# Patient Record
Sex: Female | Born: 1955 | Race: White | Hispanic: No | Marital: Married | Smoking: Never smoker
Health system: Southern US, Community
[De-identification: ages and names within clinical notes are randomized; demographics above are authoritative.]

## PROBLEM LIST (undated history)

## (undated) DIAGNOSIS — E119 Type 2 diabetes mellitus without complications: Secondary | ICD-10-CM

## (undated) DIAGNOSIS — E785 Hyperlipidemia, unspecified: Secondary | ICD-10-CM

## (undated) DIAGNOSIS — I10 Essential (primary) hypertension: Secondary | ICD-10-CM

## (undated) DIAGNOSIS — E079 Disorder of thyroid, unspecified: Secondary | ICD-10-CM

---

## 1999-08-07 ENCOUNTER — Ambulatory Visit (HOSPITAL_COMMUNITY): Admission: RE | Admit: 1999-08-07 | Discharge: 1999-08-07 | Payer: Self-pay | Admitting: Family Medicine

## 1999-08-11 ENCOUNTER — Ambulatory Visit (HOSPITAL_COMMUNITY): Admission: RE | Admit: 1999-08-11 | Discharge: 1999-08-11 | Payer: Self-pay | Admitting: Internal Medicine

## 2008-11-10 ENCOUNTER — Encounter: Admission: RE | Admit: 2008-11-10 | Discharge: 2008-11-10 | Payer: Self-pay | Admitting: Obstetrics and Gynecology

## 2015-02-23 DIAGNOSIS — E119 Type 2 diabetes mellitus without complications: Secondary | ICD-10-CM | POA: Insufficient documentation

## 2015-02-23 DIAGNOSIS — E039 Hypothyroidism, unspecified: Secondary | ICD-10-CM | POA: Insufficient documentation

## 2015-02-23 DIAGNOSIS — E785 Hyperlipidemia, unspecified: Secondary | ICD-10-CM | POA: Insufficient documentation

## 2015-02-23 DIAGNOSIS — I10 Essential (primary) hypertension: Secondary | ICD-10-CM | POA: Insufficient documentation

## 2016-03-19 DIAGNOSIS — R519 Headache, unspecified: Secondary | ICD-10-CM | POA: Insufficient documentation

## 2016-03-19 DIAGNOSIS — R51 Headache: Secondary | ICD-10-CM

## 2016-05-23 ENCOUNTER — Encounter (HOSPITAL_COMMUNITY): Payer: Self-pay | Admitting: Emergency Medicine

## 2016-05-23 ENCOUNTER — Ambulatory Visit (HOSPITAL_COMMUNITY)
Admission: EM | Admit: 2016-05-23 | Discharge: 2016-05-23 | Disposition: A | Discharge status: Home / Self Care | Payer: No Typology Code available for payment source | Attending: Emergency Medicine | Admitting: Emergency Medicine

## 2016-05-23 DIAGNOSIS — H1132 Conjunctival hemorrhage, left eye: Secondary | ICD-10-CM

## 2016-05-23 HISTORY — DX: Essential (primary) hypertension: I10

## 2016-05-23 HISTORY — DX: Hyperlipidemia, unspecified: E78.5

## 2016-05-23 HISTORY — DX: Disorder of thyroid, unspecified: E07.9

## 2016-05-23 HISTORY — DX: Type 2 diabetes mellitus without complications: E11.9

## 2016-05-23 NOTE — ED Triage Notes (Addendum)
Pt was at work this morning when she felt something in her eye.  She rubbed the eye and someone later told her that her eye was red.  Pt does not complain of pain, just some mild discomfort with blinking.  The sclera is red.  Pt used medication in and on her eye that she has from her country [place]. Pt has three family members with her to help with translation.  She speaks some English, but they helped with what she did not know.

## 2016-05-23 NOTE — ED Provider Notes (Signed)
HPI  SUBJECTIVE:  Monica Walker is a 60 y.o. female who presents with left conjunctival injection starting earlier today. Patient states that her eye was "tingling" and she scratched it. A coworker noticed that it was red shortly thereafter. She has tried a hydrocortisone, bacitracin ophthalmic ointment on this without improvement. There are no other aggravating or alleviating factors. She denies nausea, vomiting, fevers, eye pain, pain with extraocular movements. No photophobia, headache, URI-like symptoms, sick contacts. She wears glasses for both near and far distance. No known exposure to chemicals. No foreign body sensation. No pain worse in the door, he was around lights. No other known trauma to the eye. He she denies A staxis, hematuria, melena, hematochezia. Past medical history diabetes, hypothyroidism, hypertension, hypercholesterolemia. She is not any anticoagulants or antiplatelets. No history of glaucoma, thrombocytopenia or coagulopathy. PMD: Dr. Forestine NaKim Briscoff.   All history obtained through family acting as interpreter.  Past Medical History:  Diagnosis Date  . Diabetes mellitus without complication (HCC)   . HLD (hyperlipidemia)   . Hypertension   . Thyroid disease     History reviewed. No pertinent surgical history.  History reviewed. No pertinent family history.  Social History  Substance Use Topics  . Smoking status: Never Smoker  . Smokeless tobacco: Never Used  . Alcohol use No    No current facility-administered medications for this encounter.   Current Outpatient Prescriptions:  .  levothyroxine (SYNTHROID, LEVOTHROID) 100 MCG tablet, Take 100 mcg by mouth daily before breakfast., Disp: , Rfl:  .  metFORMIN (GLUCOPHAGE) 1000 MG tablet, Take 500 mg by mouth 2 (two) times daily with a meal., Disp: , Rfl:  .  simvastatin (ZOCOR) 10 MG tablet, Take 10 mg by mouth daily., Disp: , Rfl:  .  valsartan (DIOVAN) 80 MG tablet, Take 80 mg by mouth daily., Disp: , Rfl:    No Known Allergies   ROS  As noted in HPI.   Physical Exam  BP 158/82 (BP Location: Left Arm)   Pulse 65   Temp 97.6 F (36.4 C) (Oral)   SpO2 99%   Constitutional: Well developed, well nourished, no acute distress Eyes:  EOMI, Positive large subconjunctival hemorrhage left eye. No chemosis. PERRLA, no pain with EOMs. No direct or consensual photophobia. No evidence of entrapment. No foreign body seen on lid eversion. No foreign body or corneal abrasion seen on fluorescein exam. Seidel negative. No periorbital erythema, edema. Corrected Visual acuity left eye 20/20.  HENT: Normocephalic, atraumatic,mucus membranes moist Respiratory: Normal inspiratory effort Cardiovascular: Normal rate GI: nondistended skin: No rash, skin intact Musculoskeletal: no deformities Neurologic: Alert & oriented x 3, no focal neuro deficits Psychiatric: Speech and behavior appropriate   ED Course   Medications - No data to display  No orders of the defined types were placed in this encounter.   No results found for this or any previous visit (from the past 24 hour(s)). No results found.  ED Clinical Impression  Subconjunctival hemorrhage of left eye   ED Assessment/Plan  Presentation suggestive of a a large subconjunctival hemorrhage. No evidence of glaucoma, infection, abrasion, or foreign body, or globe penetration. Cool compresses, We'll have her follow-up with Dr. Randon GoldsmithLyles, ophthalmology on call if no better in 2 or 3 days.  Discussed MDM, plan and followup with patient and family.   Family agrees with plan.   *This clinic note was created using Dragon dictation software. Therefore, there may be occasional mistakes despite careful proofreading.  ?   Domenick GongAshley Del Wiseman,  MD 05/24/16 1610

## 2016-05-23 NOTE — Discharge Instructions (Signed)
Cool compresses for now, then switch to warm compresses in several days. Go to the ER for blurry vision, double vision, headache, if you see halos around lights, eye pain, fevers above 100.4, or other concerns.

## 2016-06-27 DIAGNOSIS — K635 Polyp of colon: Secondary | ICD-10-CM | POA: Insufficient documentation

## 2016-06-29 ENCOUNTER — Other Ambulatory Visit: Payer: Self-pay | Admitting: Family Medicine

## 2016-06-30 LAB — CMP12+LP+TP+TSH+6AC+CBC/D/PLT
A/G RATIO: 1.9 (ref 1.2–2.2)
ALK PHOS: 63 IU/L (ref 39–117)
ALT: 16 IU/L (ref 0–32)
AST: 16 IU/L (ref 0–40)
Albumin: 4.4 g/dL (ref 3.5–5.5)
BASOS: 1 %
BUN/Creatinine Ratio: 25 — ABNORMAL HIGH (ref 9–23)
BUN: 19 mg/dL (ref 6–24)
Basophils Absolute: 0 10*3/uL (ref 0.0–0.2)
Bilirubin Total: 0.6 mg/dL (ref 0.0–1.2)
Calcium: 9.5 mg/dL (ref 8.7–10.2)
Chloride: 100 mmol/L (ref 96–106)
Chol/HDL Ratio: 2.4 ratio units (ref 0.0–4.4)
Cholesterol, Total: 177 mg/dL (ref 100–199)
Creatinine, Ser: 0.76 mg/dL (ref 0.57–1.00)
EOS (ABSOLUTE): 0.3 10*3/uL (ref 0.0–0.4)
Eos: 5 %
Estimated CHD Risk: <0.5 times avg. (ref 0.0–1.0)
Free Thyroxine Index: 2.7 (ref 1.2–4.9)
GFR calc non Af Amer: 87 mL/min/{1.73_m2} (ref >59)
GFR, EST AFRICAN AMERICAN: 100 mL/min/{1.73_m2} (ref >59)
GGT: 16 IU/L (ref 0–60)
GLOBULIN, TOTAL: 2.3 g/dL (ref 1.5–4.5)
Glucose: 115 mg/dL — ABNORMAL HIGH (ref 65–99)
HDL: 73 mg/dL (ref >39)
HEMATOCRIT: 38.5 % (ref 34.0–46.6)
Hemoglobin: 13 g/dL (ref 11.1–15.9)
IMMATURE GRANS (ABS): 0 10*3/uL (ref 0.0–0.1)
IMMATURE GRANULOCYTES: 0 %
Iron: 109 ug/dL (ref 27–159)
LDH: 235 IU/L — AB (ref 119–226)
LDL CALC: 82 mg/dL (ref 0–99)
LYMPHS: 31 %
Lymphocytes Absolute: 2 10*3/uL (ref 0.7–3.1)
MCH: 28.5 pg (ref 26.6–33.0)
MCHC: 33.8 g/dL (ref 31.5–35.7)
MCV: 84 fL (ref 79–97)
MONOCYTES: 6 %
Monocytes Absolute: 0.4 10*3/uL (ref 0.1–0.9)
NEUTROS ABS: 3.8 10*3/uL (ref 1.4–7.0)
Neutrophils: 57 %
Phosphorus: 3.1 mg/dL (ref 2.5–4.5)
Platelets: 143 10*3/uL — ABNORMAL LOW (ref 150–379)
Potassium: 4 mmol/L (ref 3.5–5.2)
RBC: 4.56 x10E6/uL (ref 3.77–5.28)
RDW: 13.9 % (ref 12.3–15.4)
Sodium: 141 mmol/L (ref 134–144)
T3 Uptake Ratio: 29 % (ref 24–39)
T4, Total: 9.4 ug/dL (ref 4.5–12.0)
TRIGLYCERIDES: 110 mg/dL (ref 0–149)
TSH: 2.12 u[IU]/mL (ref 0.450–4.500)
Total Protein: 6.7 g/dL (ref 6.0–8.5)
Uric Acid: 4.4 mg/dL (ref 2.5–7.1)
VLDL Cholesterol Cal: 22 mg/dL (ref 5–40)
WBC: 6.5 10*3/uL (ref 3.4–10.8)

## 2016-06-30 LAB — HGB A1C W/O EAG: Hgb A1c MFr Bld: 6.4 % — ABNORMAL HIGH (ref 4.8–5.6)

## 2017-01-22 ENCOUNTER — Ambulatory Visit: Payer: Self-pay

## 2017-01-22 ENCOUNTER — Other Ambulatory Visit: Payer: Self-pay | Admitting: Occupational Medicine

## 2017-01-22 ENCOUNTER — Ambulatory Visit: Payer: Self-pay | Admitting: Registered Nurse

## 2017-01-22 VITALS — BP 135/87 | HR 74 | Temp 98.4°F

## 2017-01-22 DIAGNOSIS — M79645 Pain in left finger(s): Secondary | ICD-10-CM

## 2017-01-22 DIAGNOSIS — S80211A Abrasion, right knee, initial encounter: Secondary | ICD-10-CM

## 2017-01-22 DIAGNOSIS — S60012A Contusion of left thumb without damage to nail, initial encounter: Secondary | ICD-10-CM

## 2017-01-22 DIAGNOSIS — M79642 Pain in left hand: Secondary | ICD-10-CM

## 2017-01-22 MED ORDER — ACETAMINOPHEN 325 MG PO TABS: 650.0000 mg | ORAL_TABLET | Freq: Four times a day (QID) | ORAL | Status: AC | PRN

## 2017-01-22 NOTE — Patient Instructions (Addendum)
Patient to go to [facility] for xrays/further evaluation today Wash abrasion right knee daily with soap and water apply topical antibiotic and cover with bandage until healed Follow up for re-evaluation if worsening redness/ purulent discharge, increased pain/swelling Apply ice pack to left hand and right knee four times a day x 15 minutes as needed pain/swelling Tylenol 650-975mg  by mouth every 6 hours as needed for pain Has naproxen  at home may take twice a day  Avoid motrin/advil/aleve/ibuprofen when taking naproxen same day Hydrate avoid dehdyration Given ace wrap from clinic stock may wrap right knee as needed for comfort Elevate left hand and right knee when sitting Fitted and distributed left wrist/thumb spica splint from clinic stock wear until told to stop use  Notify supervisor and HR of injury today when walking into building  Abrasion An abrasion is a cut or scrape on the outer surface of your skin. An abrasion does not extend through all of the layers of your skin. It is important to care for your abrasion properly to prevent infection. What are the causes? Most abrasions are caused by falling on or gliding across the ground or another surface. When your skin rubs on something, the outer and inner layer of skin rubs off. What are the signs or symptoms? A cut or scrape is the main symptom of this condition. The scrape may be bleeding, or it may appear red or pink. If there was an associated fall, there may be an underlying bruise. How is this diagnosed? An abrasion is diagnosed with a physical exam. How is this treated? Treatment for this condition depends on how large and deep the abrasion is. Usually, your abrasion will be cleaned with water and mild soap. This removes any dirt or debris that may be stuck. An antibiotic ointment may be applied to the abrasion to help prevent infection. A bandage (dressing) may be placed on the abrasion to keep it  clean. You may also need a tetanus shot. Follow these instructions at home: Medicines   Take or apply medicines only as directed by your health care provider.  If you were prescribed an antibiotic ointment, finish all of it even if you start to feel better. Wound care   Clean the wound with mild soap and water 2-3 times per day or as directed by your health care provider. Pat your wound dry with a clean towel. Do not rub it.  There are many different ways to close and cover a wound. Follow instructions from your health care provider about:  Wound care.  Dressing changes and removal.  Check your wound every day for signs of infection. Watch for:  Redness, swelling, or pain.  Fluid, blood, or pus. General instructions    Keep the dressing dry as directed by your health care provider. Do not take baths, swim, use a hot tub, or do anything that would put your wound underwater until your health care provider approves.  If there is swelling, raise (elevate) the injured area above the level of your heart while you are sitting or lying down.  Keep all follow-up visits as directed by your health care provider. This is important. Contact a health care provider if:  You received a tetanus shot and you have swelling, severe pain, redness, or bleeding at the injection site.  Your pain is not controlled with medicine.  You have increased redness, swelling, or pain at the site of your wound. Get help right away if:  You have  a red streak going away from your wound.  You have a fever.  You have fluid, blood, or pus coming from your wound.  You notice a bad smell coming from your wound or your dressing. This information is not intended to replace advice given to you by your health care provider. Make sure you discuss any questions you have with your health care provider. Document Released: 06/20/2005 Document Revised: 05/11/2016 Document Reviewed: 09/08/2014 Elsevier Interactive  Patient Education  2017 [employer]  Contusion A contusion is a deep bruise. Contusions are the result of a blunt injury to tissues and muscle fibers under the skin. The injury causes bleeding under the skin. The skin overlying the contusion may turn blue, purple, or yellow. Minor injuries will give you a painless contusion, but more severe contusions may stay painful and swollen for a few weeks. What are the causes? This condition is usually caused by a blow, trauma, or direct force to an area of the body. What are the signs or symptoms? Symptoms of this condition include:  Swelling of the injured area.  Pain and tenderness in the injured area.  Discoloration. The area may have redness and then turn blue, purple, or yellow. How is this diagnosed? This condition is diagnosed based on a physical exam and medical history. An X-ray, CT scan, or MRI may be needed to determine if there are any associated injuries, such as broken bones (fractures). How is this treated? Specific treatment for this condition depends on what area of the body was injured. In general, the best treatment for a contusion is resting, icing, applying pressure to (compression), and elevating the injured area. This is often called the RICE strategy. Over-the-counter anti-inflammatory medicines may also be recommended for pain control. Follow these instructions at home:  Rest the injured area.  If directed, apply ice to the injured area:  Put ice in a plastic bag.  Place a towel between your skin and the bag.  Leave the ice on for 20 minutes, 2-3 times per day.  If directed, apply light compression to the injured area using an elastic bandage. Make sure the bandage is not wrapped too tightly. Remove and reapply the bandage as directed by your health care provider.  If possible, raise (elevate) the injured area above the level of your heart while you are sitting or lying down.  Take over-the-counter and  prescription medicines only as told by your health care provider. Contact a health care provider if:  Your symptoms do not improve after several days of treatment.  Your symptoms get worse.  You have difficulty moving the injured area. Get help right away if:  You have severe pain.  You have numbness in a hand or foot.  Your hand or foot turns pale or cold. This information is not intended to replace advice given to you by your health care provider. Make sure you discuss any questions you have with your health care provider. Document Released: 06/20/2005 Document Revised: 01/19/2016 Document Reviewed: 01/26/2015 Elsevier Interactive Patient Education  2017 [employer] Thumb Sprain A thumb sprain is an injury to one of the strong bands of tissue (ligaments) that connect the bones in your thumb. The ligament can be stretched too much or it can tear. A tear can be either partial or complete. The severity of the sprain depends on how much of the ligament was damaged or torn. What are the causes? A thumb sprain is often caused by a fall or an accident. If  you extend your hands to catch an object or to protect yourself, the force of the impact can cause your ligament to stretch too much. This excess tension can also cause your ligament to tear. What increases the risk? This injury is more likely to occur in people who play:  Sports that involve a greater risk of falling, such as skiing.  Sports that involve catching an object, such as basketball. What are the signs or symptoms? Symptoms of this condition include:  Loss of motion in your thumb.  Bruising.  Tenderness.  Swelling. How is this diagnosed? This condition is diagnosed with a medical history and physical exam. You may also have an X-ray of your thumb. How is this treated? Treatment varies depending on the severity of your sprain. If your ligament is overstretched or partially torn, treatment usually involves keeping  your thumb in a fixed position (immobilization) for a period of time. To help you do this, your health care provider will apply a bandage, cast, or splint to keep your thumb from moving until it heals. If your ligament is fully torn, you may need surgery to reconnect the ligament to the bone. After surgery, a cast or splint will be applied and will need to stay on your thumb while it heals. Your health care provider may also suggest exercises or physical therapy to strengthen your thumb. Follow these instructions at home: If you have a cast:   Do not stick anything inside the cast to scratch your skin. Doing that increases your risk of infection.  Check the skin around the cast every day. Report any concerns to your health care provider. You may put lotion on dry skin around the edges of the cast. Do not apply lotion to the skin underneath the cast.  Keep the cast clean and dry. If you have a splint:   Wear it as directed by your health care provider. Remove it only as directed by your health care provider.  Loosen the splint if your fingers become numb and tingle, or if they turn cold and blue.  Keep the splint clean and dry. Bathing   Cover the bandage, cast, or splint with a watertight plastic bag to protect it from water while you take a bath or a shower. Do not let the bandage, cast, or splint get wet. Managing pain, stiffness, and swelling   If directed, apply ice to the injured area (unless you have a cast):  Put ice in a plastic bag.  Place a towel between your skin and the bag.  Leave the ice on for 20 minutes, 2-3 times per day.  Move your fingers often to avoid stiffness and to lessen swelling.  Raise (elevate) the injured area above the level of your heart while you are sitting or lying down. Driving   Do not drive or operate heavy machinery while taking pain medicine.  Do not drive while wearing a cast or splint on a hand that you use for driving. General  instructions   Do not put pressure on any part of your cast or splint until it is fully hardened. This may take several hours.  Take medicines only as directed by your health care provider. These include over-the-counter medicines and prescription medicines.  Keep all follow-up visits as directed by your health care provider. This is important.  Do any exercise or physical therapy as directed by your health care provider.  Do not wear rings on your injured thumb. Contact a health care provider  if:  Your pain is not controlled with medicine.  Your bruising or swelling gets worse.  Your cast or splint is damaged. Get help right away if:  Your thumb is numb or blue.  Your thumb feels colder than normal. This information is not intended to replace advice given to you by your health care provider. Make sure you discuss any questions you have with your health care provider. Document Released: 10/18/2004 Document Revised: 05/13/2016 Document Reviewed: 06/22/2014 Elsevier Interactive Patient Education  2017 [employer]  Cryotherapy WHAT IS CRYOTHERAPY? Cryotherapy, or cold therapy, is a treatment that uses cold temperatures to treat an injury or medical condition. It includes using cold packs or ice packs to reduce pain and swelling. WHO SHOULD NOT USE CRYOTHERAPY? Cryotherapy is not safe for people who cannot tell you if they are in pain, such as small children and people who have dementia. Cryotherapy is also not safe for people with certain conditions, such as:  Raynaud phenomenon.  Cold hypersensitivity.  Numbness or loss of feeling in the area being iced. Cryotherapy may or may not be safe for people with certain other conditions. Do not use cryotherapy without your health care provider's approval if you have:  A heart condition.  High blood pressure.  Open or healing wounds.  An infection.  Rheumatoid arthritis.  Poor circulation.  Diabetes.  Certain skin  conditions. HOW DO I USE CRYOTHERAPY? To use cryotherapy at home to reduce pain and swelling:  Place a towel between the cold source and your skin.  Apply the cold source for no more than 20 minutes at a time.  Check your skin after 5 minutes to make sure there are no signs of a poor response to cold or skin damage. Check for:  White spots on your skin. Your skin may look blotchy or mottled.  Skin that looks blue or pale.  Skin that feels waxy or hard.  Repeat these steps as many times each day as told by your health care provider. HOW CAN I MAKE A COLD PACK? When using a cold pack at home to reduce pain and swelling, you can use:  A silica gel cold pack that has been left in the freezer. You can buy this online or in stores.  A plastic bag of frozen vegetables.  A sealable plastic bag that has been filled with crushed ice. Always wrap the pack in a dry or damp towel to avoid direct contact with your skin. WHEN SHOULD I CALL MY HEALTH CARE PROVIDER? Call your health care provider if:  You develop white spots on your skin. This may give your skin a blotchy or mottled look.  Your skin turns blue or pale.  Your skin becomes waxy or hard.  Your swelling gets worse. This information is not intended to replace advice given to you by your health care provider. Make sure you discuss any questions you have with your health care provider. Document Released: 05/07/2011 Document Revised: 02/16/2016 Document Reviewed: 05/25/2015 Elsevier Interactive Patient Education  2017 [employer] Thumb Sprain A thumb sprain is an injury to one of the strong bands of tissue (ligaments) that connect the bones in your thumb. The ligament can be stretched too much or it can tear. A tear can be either partial or complete. The severity of the sprain depends on how much of the ligament was damaged or torn. What are the causes? A thumb sprain is often caused by a fall or an accident. If you extend your  hands to catch an object or to protect yourself, the force of the impact can cause your ligament to stretch too much. This excess tension can also cause your ligament to tear. What increases the risk? This injury is more likely to occur in people who play:  Sports that involve a greater risk of falling, such as skiing.  Sports that involve catching an object, such as basketball. What are the signs or symptoms? Symptoms of this condition include:  Loss of motion in your thumb.  Bruising.  Tenderness.  Swelling. How is this diagnosed? This condition is diagnosed with a medical history and physical exam. You may also have an X-ray of your thumb. How is this treated? Treatment varies depending on the severity of your sprain. If your ligament is overstretched or partially torn, treatment usually involves keeping your thumb in a fixed position (immobilization) for a period of time. To help you do this, your health care provider will apply a bandage, cast, or splint to keep your thumb from moving until it heals. If your ligament is fully torn, you may need surgery to reconnect the ligament to the bone. After surgery, a cast or splint will be applied and will need to stay on your thumb while it heals. Your health care provider may also suggest exercises or physical therapy to strengthen your thumb. Follow these instructions at home: If you have a cast:   Do not stick anything inside the cast to scratch your skin. Doing that increases your risk of infection.  Check the skin around the cast every day. Report any concerns to your health care provider. You may put lotion on dry skin around the edges of the cast. Do not apply lotion to the skin underneath the cast.  Keep the cast clean and dry. If you have a splint:   Wear it as directed by your health care provider. Remove it only as directed by your health care provider.  Loosen the splint if your fingers become numb and tingle, or if they  turn cold and blue.  Keep the splint clean and dry. Bathing   Cover the bandage, cast, or splint with a watertight plastic bag to protect it from water while you take a bath or a shower. Do not let the bandage, cast, or splint get wet. Managing pain, stiffness, and swelling   If directed, apply ice to the injured area (unless you have a cast):  Put ice in a plastic bag.  Place a towel between your skin and the bag.  Leave the ice on for 20 minutes, 2-3 times per day.  Move your fingers often to avoid stiffness and to lessen swelling.  Raise (elevate) the injured area above the level of your heart while you are sitting or lying down. Driving   Do not drive or operate heavy machinery while taking pain medicine.  Do not drive while wearing a cast or splint on a hand that you use for driving. General instructions   Do not put pressure on any part of your cast or splint until it is fully hardened. This may take several hours.  Take medicines only as directed by your health care provider. These include over-the-counter medicines and prescription medicines.  Keep all follow-up visits as directed by your health care provider. This is important.  Do any exercise or physical therapy as directed by your health care provider.  Do not wear rings on your injured thumb. Contact a health care provider if:  Your pain  is not controlled with medicine.  Your bruising or swelling gets worse.  Your cast or splint is damaged. Get help right away if:  Your thumb is numb or blue.  Your thumb feels colder than normal. This information is not intended to replace advice given to you by your health care provider. Make sure you discuss any questions you have with your health care provider. Document Released: 10/18/2004 Document Revised: 05/13/2016 Document Reviewed: 06/22/2014 Elsevier Interactive Patient Education  2017 [employer].

## 2017-01-22 NOTE — Progress Notes (Signed)
Subjective:    Patient ID: Monica Walker, female    DOB: [birth year], 61 y.o.   MRN: [id number]  60y/o caucasian female accompanied by coworker as [place] second language primary language serbian croatian.  Pt fell walking in to work this morning from parking lot. Stepped up on curb but foot turned off it and she fell forward into the bushes lining the walkway. Has abrasion to R knee and left thumb struck pavement unsure if it was hyperextended.  Right hand dominant. Worst pain is in L thumb where she braced herself while falling.  Mild bruising and swelling to proximal joint of thumb. TTP. Limited ROM 2/2 pain.   Patient requesting evaluation so she can return to work. Denied hitting head/loss of consciousness.  Requesting pain medication as her prn naproxen at home.  Has ice pack at home.      Review of Systems  Constitutional: Negative for activity change, appetite change, chills, diaphoresis, fatigue and fever.  HENT: Negative for congestion, ear pain and sore throat.   Eyes: Negative for pain and discharge.  Respiratory: Negative for cough and wheezing.   Cardiovascular: Negative for chest pain and leg swelling.  Gastrointestinal: Negative for blood in stool, constipation, diarrhea, nausea and vomiting.  Endocrine: Negative for cold intolerance and heat intolerance.  Genitourinary: Negative for difficulty urinating, dysuria and hematuria.  Musculoskeletal: Positive for arthralgias, joint swelling and myalgias. Negative for back pain, gait problem, neck pain and neck stiffness.  Skin: Positive for color change and wound. Negative for pallor and rash.  Allergic/Immunologic: Negative for environmental allergies and food allergies.  Neurological: Negative for dizziness, tremors, seizures, syncope, facial asymmetry, speech difficulty, weakness, light-headedness, numbness and headaches.  Hematological: Negative for adenopathy. Does not bruise/bleed easily.  Psychiatric/Behavioral: Negative  for agitation and confusion. The patient is not nervous/anxious.        Objective:   Physical Exam  Constitutional: She is oriented to person, place, and time. Vital signs are normal. She appears well-developed and well-nourished. She is active and cooperative.  Non-toxic appearance. She does not have a sickly appearance. She does not appear ill. No distress.  HENT:  Head: Normocephalic and atraumatic.  Right Ear: Hearing and external ear normal.  Left Ear: Hearing and external ear normal.  Nose: Nose normal. No mucosal edema, rhinorrhea, nose lacerations, sinus tenderness, nasal deformity, septal deviation or nasal septal hematoma. No epistaxis.  No foreign bodies.  Mouth/Throat: Uvula is midline, oropharynx is clear and moist and mucous membranes are normal. Mucous membranes are not pale, not dry and not cyanotic. She does not have dentures. No oral lesions. No trismus in the jaw. Normal dentition. No dental abscesses, uvula swelling, lacerations or dental caries. No oropharyngeal exudate, posterior oropharyngeal edema, posterior oropharyngeal erythema or tonsillar abscesses.  Eyes: Conjunctivae, EOM and lids are normal. Pupils are equal, round, and reactive to light. Right eye exhibits no chemosis, no discharge, no exudate and no hordeolum. No foreign body present in the right eye. Left eye exhibits no chemosis, no discharge, no exudate and no hordeolum. No foreign body present in the left eye. Right conjunctiva is not injected. Right conjunctiva has no hemorrhage. Left conjunctiva is not injected. Left conjunctiva has no hemorrhage. No scleral icterus. Right eye exhibits normal extraocular motion and no nystagmus. Left eye exhibits normal extraocular motion and no nystagmus. Right pupil is round and reactive. Left pupil is round and reactive. Pupils are equal.  Neck: Trachea normal, normal range of motion and phonation normal. Neck supple.  No tracheal tenderness, no spinous process tenderness and  no muscular tenderness present. No neck rigidity. No tracheal deviation, no edema, no erythema and normal range of motion present. No thyroid mass and no thyromegaly present.  Cardiovascular: Normal rate, regular rhythm, S1 normal, S2 normal, normal heart sounds and intact distal pulses.  PMI is not displaced.  Exam reveals no gallop and no friction rub.   No murmur heard. Pulses:      Radial pulses are 2+ on the right side, and 2+ on the left side.       Dorsalis pedis pulses are 2+ on the right side, and 2+ on the left side.  Pulmonary/Chest: Effort normal and breath sounds normal. No accessory muscle usage or stridor. No respiratory distress. She has no decreased breath sounds. She has no wheezes. She has no rhonchi. She has no rales. She exhibits no tenderness.  Abdominal: Soft. She exhibits no distension.  Musculoskeletal: Normal range of motion. She exhibits edema, tenderness and deformity.       Right shoulder: Normal.       Left shoulder: Normal.       Right elbow: Normal.      Left elbow: Normal.       Right wrist: Normal.       Left wrist: Normal.       Right hip: Normal.       Left hip: Normal.       Right knee: She exhibits erythema. She exhibits normal range of motion, no swelling, no effusion, no ecchymosis, no deformity, no laceration, normal alignment, no LCL laxity, normal patellar mobility, no bony tenderness, normal meniscus and no MCL laxity. Tenderness found. No medial joint line, no lateral joint line, no MCL, no LCL and no patellar tendon tenderness noted.       Left knee: Normal.       Right ankle: Normal.       Left ankle: Normal.       Cervical back: Normal.       Right upper arm: Normal.       Left upper arm: Normal.       Right forearm: Normal.       Left forearm: Normal.       Right hand: Normal.       Left hand: She exhibits tenderness, bony tenderness and swelling. She exhibits normal range of motion, normal two-point discrimination, normal capillary refill,  no deformity and no laceration. Normal sensation noted. Normal strength noted. She exhibits no finger abduction, no thumb/finger opposition and no wrist extension trouble.       Hands:      Right upper leg: Normal.       Left upper leg: Normal.       Right lower leg: Normal.       Left lower leg: Normal.       Right foot: Normal.       Left foot: Normal.  Left snuff box tenderness and ecchymosis over dorsum PIP joint and adjacent soft tissue 1cm diameter edema 1+/4 nonpitting; full AROM aok sign all fingers and full wrist AROM/elbow/shouder; on/off exam table without difficulty ambulating in hallway without limp/in/out chair without difficulty; abrasion x 2 anterior right knee over patella superior smaller 1cm diameter and larger distal 2cm diameter no active bleeding cleansed with first aid spray then applied triple antibiotic ointment and covered with bandaid and secured with cobain dressing clean dry and intact on discharge; negative crepitus/lachman's/anterior/posterior drawer sign/valgus/varus stress tests  Lymphadenopathy:       Head (right side): No submental, no submandibular, no tonsillar, no preauricular, no posterior auricular and no occipital adenopathy present.       Head (left side): No submental, no submandibular, no tonsillar, no preauricular, no posterior auricular and no occipital adenopathy present.    She has no cervical adenopathy.       Right cervical: No superficial cervical, no deep cervical and no posterior cervical adenopathy present.      Left cervical: No superficial cervical, no deep cervical and no posterior cervical adenopathy present.  Neurological: She is alert and oriented to person, place, and time. She has normal strength. She is not disoriented. She displays no atrophy and no tremor. No cranial nerve deficit or sensory deficit. She exhibits normal muscle tone. She displays no seizure activity. Coordination and gait normal. GCS eye subscore is 4. GCS verbal subscore  is 5. GCS motor subscore is 6.  Strength 5/5 arms/legs/hands bilaterally  Skin: Skin is warm and dry. Abrasion, bruising, ecchymosis and rash noted. No burn, no laceration, no lesion, no petechiae and no purpura noted. Rash is macular. Rash is not papular, not maculopapular, not nodular, not pustular, not vesicular and not urticarial. She is not diaphoretic. There is erythema. No cyanosis. No pallor. Nails show no clubbing.     Psychiatric: She has a normal mood and affect. Her speech is normal and behavior is normal. Judgment and thought content normal. Cognition and memory are normal.  Nursing note and vitals reviewed.         Assessment & Plan:  A-right knee abrasion; left hand pain; thumb pain/contusion left  P-Fitted and distributed left wrist/thumb spica splint from clinic stock wear until cleared from fracture.  Recommend xrays today as snuff box pain and ecchymosis/edema left thumb dorsum.  Disccussed could be contusion/fracture thumb/hand, ligament/tendon tear.  Speak with supervisor and HR rep regarding accident in parking lot today to determine if worker's compensation eligible.  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  Motrin  po administered in clinic today.  Given 6 UD tylenol  po q6h prn pain.  Discussed could restart naproxen  po BID at bedtime tonight.  Motrin/ibuprofen/advil/aleve/naprosyn in same family and should pick one only as otherwise overdosing on NSAIDs if taking multiple types in one day.  May take tylenol and naproxen alternating in same day.  Discussed nsaids can counteract her blood pressure medication but tylenol typically does not.  Elevate hand and leg when sitting to decrease swelling.  Given ace wrap from clinic stock may wrap right knee prn pain/compression helps decrease swelling also.  Ice pack instant given to patient for use discussed 15 minutes 4  times per day prn hand left and right knee prn pain.  Call or return to clinic as needed if these symptoms worsen or fail to improve as anticipated.  Patient verbalized agreement and understanding of treatment plan.   P2:  ROM exercises,   Patient was instructed to rest extremities.  Do not soak arms until abrasions healed avoid pool, lake, hot tub, dirty sink water.  May shower apply triple antibiotic and first aid spray prn BID keep wounds covered they will heal faster and prevent contamination rubbing from clothing tearing off scabs.  Exitcare handout on contusion, abrasion given to patient. Given bandaids, 7 UD triple antibiotic from clinic stock.  Medications as directed.  Call or return to clinic as  needed if these symptoms worsen or fail to improve as anticipated.  Discussed signs and symptoms of cellulitis/infection with patient to follow up prn e.g. Redness, swelling, hot, increased pain, purulent discharge.  Patient verbalized agreement and understanding of treatment plan.  P2:  ROM, injury prevention  Continue splint wear left hand/thumb, at home may remove for gentle range of motion & shower. Avoid impact to left hand.  Exitcare handout on thumb sprain, rehab exercises given to patient.  Patient verbalized agreement and understanding of treatment plan and had no further questions at this time.  P2:  ROM, injury prevention

## 2017-07-04 ENCOUNTER — Ambulatory Visit: Payer: Self-pay | Admitting: *Deleted

## 2017-07-04 VITALS — BP 135/82 | HR 66 | Ht 66.0 in | Wt 270.0 lb

## 2017-07-04 DIAGNOSIS — Z0000 Encounter for general adult medical examination without abnormal findings: Secondary | ICD-10-CM

## 2017-07-04 NOTE — Progress Notes (Signed)
[employer] insurance premium discount evaluation: Labs Drawn. Replacements ROI form signed. Tobacco Free Attestation form signed.  Forms placed in paper chart. Okay to route results to pcp per pt. 

## 2017-07-05 LAB — CMP12+LP+TP+TSH+6AC+CBC/D/PLT
ALBUMIN: 4.5 g/dL (ref 3.5–5.5)
ALK PHOS: 64 IU/L (ref 39–117)
ALT: 14 IU/L (ref 0–32)
AST: 14 IU/L (ref 0–40)
Albumin/Globulin Ratio: 1.8 (ref 1.2–2.2)
BASOS: 1 %
BUN/Creatinine Ratio: 19 (ref 9–23)
BUN: 17 mg/dL (ref 6–24)
Basophils Absolute: 0 10*3/uL (ref 0.0–0.2)
Bilirubin Total: 0.5 mg/dL (ref 0.0–1.2)
CALCIUM: 9.4 mg/dL (ref 8.7–10.2)
CHLORIDE: 100 mmol/L (ref 96–106)
CHOL/HDL RATIO: 2.6 ratio (ref 0.0–4.4)
CHOLESTEROL TOTAL: 177 mg/dL (ref 100–199)
Creatinine, Ser: 0.88 mg/dL (ref 0.57–1.00)
EOS (ABSOLUTE): 0.2 10*3/uL (ref 0.0–0.4)
Eos: 4 %
FREE THYROXINE INDEX: 2.8 (ref 1.2–4.9)
GFR calc Af Amer: 83 mL/min/{1.73_m2} (ref >59)
GFR calc non Af Amer: 72 mL/min/{1.73_m2} (ref >59)
GGT: 15 IU/L (ref 0–60)
GLOBULIN, TOTAL: 2.5 g/dL (ref 1.5–4.5)
Glucose: 124 mg/dL — ABNORMAL HIGH (ref 65–99)
HDL: 69 mg/dL (ref >39)
HEMATOCRIT: 40.6 % (ref 34.0–46.6)
Hemoglobin: 13.1 g/dL (ref 11.1–15.9)
Immature Grans (Abs): 0 10*3/uL (ref 0.0–0.1)
Immature Granulocytes: 0 %
Iron: 100 ug/dL (ref 27–159)
LDH: 235 IU/L — ABNORMAL HIGH (ref 119–226)
LDL CALC: 81 mg/dL (ref 0–99)
LYMPHS ABS: 1.8 10*3/uL (ref 0.7–3.1)
Lymphs: 29 %
MCH: 28.2 pg (ref 26.6–33.0)
MCHC: 32.3 g/dL (ref 31.5–35.7)
MCV: 87 fL (ref 79–97)
MONOS ABS: 0.5 10*3/uL (ref 0.1–0.9)
Monocytes: 7 %
NEUTROS ABS: 3.8 10*3/uL (ref 1.4–7.0)
Neutrophils: 59 %
PHOSPHORUS: 3 mg/dL (ref 2.5–4.5)
POTASSIUM: 3.9 mmol/L (ref 3.5–5.2)
Platelets: 156 10*3/uL (ref 150–379)
RBC: 4.65 x10E6/uL (ref 3.77–5.28)
RDW: 13.8 % (ref 12.3–15.4)
SODIUM: 138 mmol/L (ref 134–144)
T3 Uptake Ratio: 28 % (ref 24–39)
T4 TOTAL: 10.1 ug/dL (ref 4.5–12.0)
TRIGLYCERIDES: 133 mg/dL (ref 0–149)
TSH: 2.55 u[IU]/mL (ref 0.450–4.500)
Total Protein: 7 g/dL (ref 6.0–8.5)
Uric Acid: 5.1 mg/dL (ref 2.5–7.1)
VLDL Cholesterol Cal: 27 mg/dL (ref 5–40)
WBC: 6.3 10*3/uL (ref 3.4–10.8)

## 2017-07-05 LAB — HGB A1C W/O EAG: HEMOGLOBIN A1C: 6.9 % — AB (ref 4.8–5.6)

## 2017-07-05 NOTE — Progress Notes (Signed)
Results reviewed with pt and daughter in law. A1c elevated worse than previous. LDH elevated. Daughter in law reports pt does not follow DM2 diet. Does take Metformin daily. Reviewed and reinforced need to follow DM2 diet guidelines, low carb, low sugar, more lean proteins, veggies, etc. BP slightly elevated as well. Diet and exercise recommendations for DM2, HTN, wt management discussed. Routine f/u with pcp. Copy of labs unable to be provided to pt 2/2 power outage. May pick up hard copy in clinic next week once power is restored. Results routed to pcp per pt request. No further questions/concerns.

## 2018-08-27 IMAGING — CR DG FINGER THUMB 2+V*L*
1 series · 1 of 1 positions shown · non-contrast
Comparison: None.

CLINICAL DATA: Fall.  Hyper extension of thumb.

EXAM:
LEFT THUMB 2+V

[view not recorded]
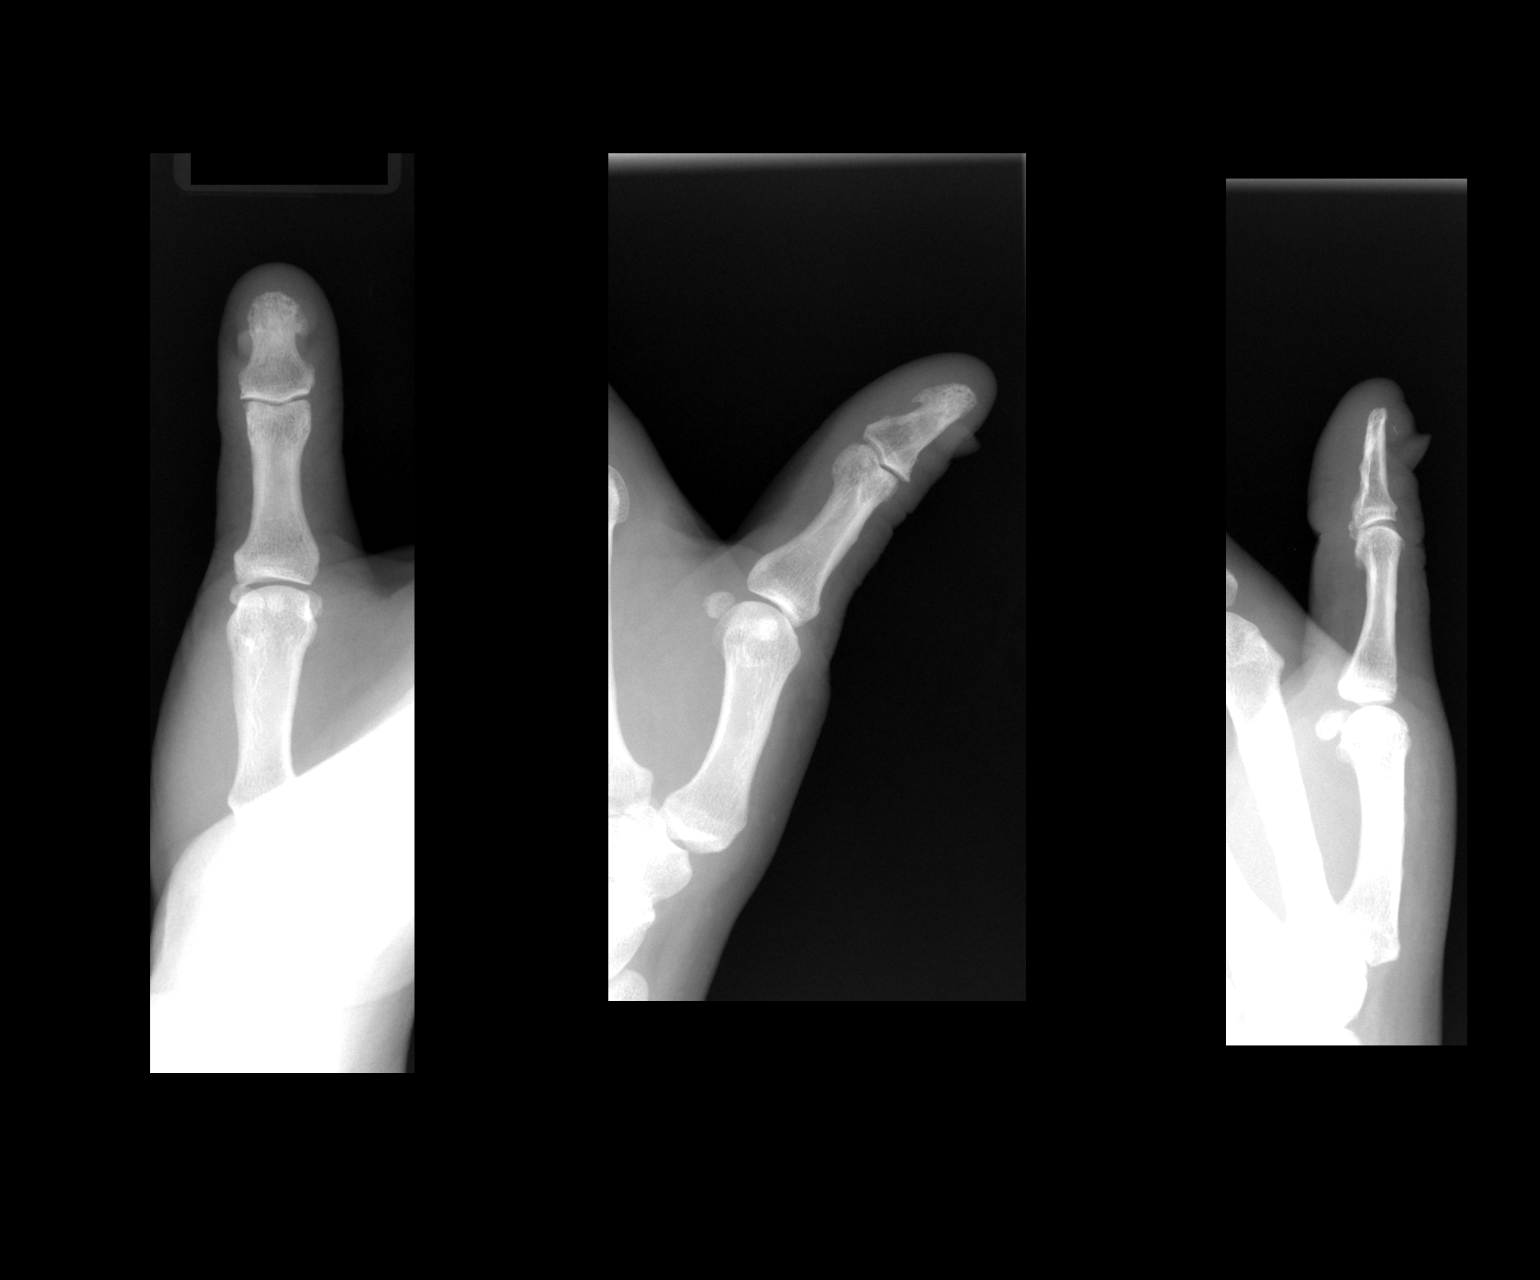

[1 of 1 positions shown; findings below may reference images not displayed]

FINDINGS: There is no evidence of fracture or dislocation. There is no
evidence of arthropathy or other focal bone abnormality. The
thumbnail appears lifted from the nail bed and there is a small
linear radiodensity measuring approximately 2 mm.
IMPRESSION: 1. No acute bone abnormality.
2. Small linear radiodensity identified within the nail bed of
uncertain etiology/significance.

## 2019-12-01 ENCOUNTER — Encounter: Payer: Self-pay | Admitting: Registered Nurse

## 2019-12-01 ENCOUNTER — Ambulatory Visit: Payer: Self-pay | Admitting: Registered Nurse

## 2019-12-01 ENCOUNTER — Other Ambulatory Visit: Payer: Self-pay

## 2019-12-01 VITALS — BP 122/82 | HR 91 | Temp 99.0°F

## 2019-12-01 DIAGNOSIS — T310 Burns involving less than 10% of body surface: Secondary | ICD-10-CM

## 2019-12-01 DIAGNOSIS — M5416 Radiculopathy, lumbar region: Secondary | ICD-10-CM

## 2019-12-01 MED ORDER — DICLOFENAC SODIUM 75 MG PO TBEC: 75.0000 mg | DELAYED_RELEASE_TABLET | Freq: Two times a day (BID) | ORAL | 0 refills | Status: DC | PRN

## 2019-12-01 MED ORDER — SILVER SULFADIAZINE 1 % EX CREA: 1.0000 "application " | TOPICAL_CREAM | Freq: Every day | CUTANEOUS | 0 refills | Status: DC

## 2019-12-01 MED ORDER — BIOFREEZE 4 % EX GEL: 1.0000 "application " | Freq: Four times a day (QID) | CUTANEOUS | Status: AC | PRN

## 2019-12-01 NOTE — Progress Notes (Signed)
Subjective:    Patient ID: Monica Walker, female    DOB: Mar 19, 1956, 64 y.o.   MRN: [id number]  64y/o Caucasian married established female pt c/o L lateral leg from ankle up to just below L buttock. Describes as shooting pain. Went to [facility] yesterday and was told it could be a DVT and was advised to proceed to ER for [place], but they were concerned about taking to ER with Covid and decided to have second opinion here in clinic.  Daughter in law with patient as english second language.  Patient does not want translator on telephone.  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  Tends to shift weight onto one leg most of shift.  Denied loss of bowel/bladder control, saddle paresthesias or arm/leg weakness.  Denied swelling/redness/tender knot area on leg.  Started over a week ago had naproxen/motrin at home not helping.  Tried some stretching but not heat or ice.  Has used biofreeze in the past but forgot to try this time.  Pt also with burn to R medial posterior forearm. Was taking something out of the oven 2 days ago and laid arm on interior door of oven. Applied left over silvadene cream yesterday but ran out needs refill.  No drainage or blisters present.  Slight swelling and tender but denied problems with working.  Has new Rx for atorvastatin 20mg  po daily and would like it filled from [facility] formulary today.  PCM stopped simvastatin.       Review of Systems  Constitutional: Negative for activity change, appetite change, chills, diaphoresis, fatigue and fever.  HENT: Negative for trouble swallowing and voice change.   Eyes: Negative for photophobia and visual disturbance.  Respiratory: Negative for cough, shortness of breath, wheezing and stridor.   Cardiovascular: Negative for chest pain, palpitations and leg swelling.  Gastrointestinal: Negative for abdominal pain, diarrhea, nausea and  vomiting.  Endocrine: Negative for cold intolerance and heat intolerance.  Genitourinary: Negative for difficulty urinating and enuresis.  Musculoskeletal: Positive for back pain and myalgias. Negative for arthralgias, gait problem, joint swelling, neck pain and neck stiffness.  Skin: Positive for color change and rash. Negative for pallor and wound.  Allergic/Immunologic: Negative for environmental allergies and food allergies.  Neurological: Negative for dizziness, tremors, seizures, syncope, facial asymmetry, speech difficulty, weakness, light-headedness, numbness and headaches.  Hematological: Negative for adenopathy. Does not bruise/bleed easily.  Psychiatric/Behavioral: Negative for agitation, confusion and sleep disturbance.       Objective:   Physical Exam Vitals and nursing note reviewed.  Constitutional:      General: She is awake. She is not in acute distress.    Appearance: Normal appearance. She is well-developed and well-groomed. She is obese. She is not ill-appearing, toxic-appearing or diaphoretic.  HENT:     Head: Normocephalic and atraumatic.     Jaw: There is normal jaw occlusion.     Salivary Glands: Right salivary gland is not diffusely enlarged or tender. Left salivary gland is not diffusely enlarged or tender.     Right Ear: Hearing and external ear normal.     Left Ear: Hearing and external ear normal.     Nose: Nose normal.     Mouth/Throat:     Lips: Pink. No lesions.     Mouth: No angioedema.     Pharynx: Oropharynx is clear.  Eyes:     General: Lids are normal. Vision  grossly intact. Gaze aligned appropriately. No allergic shiner, visual field deficit or scleral icterus.       Right eye: No discharge.        Left eye: No discharge.     Extraocular Movements: Extraocular movements intact.     Conjunctiva/sclera: Conjunctivae normal.     Pupils: Pupils are equal, round, and reactive to light.  Neck:     Thyroid: No thyromegaly.     Vascular: No JVD.      Trachea: Trachea and phonation normal. No tracheal deviation.  Cardiovascular:     Rate and Rhythm: Normal rate and regular rhythm.     Pulses: Normal pulses.          Radial pulses are 2+ on the right side and 2+ on the left side.     Heart sounds: Normal heart sounds. No murmur. No friction rub. No gallop.   Pulmonary:     Effort: Pulmonary effort is normal. No respiratory distress.     Breath sounds: Normal breath sounds and air entry. No stridor, decreased air movement or transmitted upper airway sounds. No decreased breath sounds, wheezing, rhonchi or rales.     Comments: Spoke full sentences without difficulty; wearing cloth mask due to covid 19 pandemic; no cough observed in exam room Abdominal:     General: Abdomen is flat. There is no distension.     Palpations: Abdomen is soft.  Musculoskeletal:        General: Swelling, tenderness and signs of injury present. No deformity. Normal range of motion.     Right shoulder: Normal.     Left shoulder: Normal.     Right elbow: Normal.     Left elbow: Normal.     Right forearm: Swelling and tenderness present. No edema, deformity, lacerations or bony tenderness.     Left forearm: Normal.     Right wrist: Normal.     Left wrist: Normal.     Right hand: Normal.     Left hand: Normal.       Arms:     Cervical back: Normal, normal range of motion and neck supple. No swelling, edema, deformity, erythema, signs of trauma, lacerations, rigidity, spasms, torticollis, tenderness, bony tenderness or crepitus. No pain with movement, spinous process tenderness or muscular tenderness. Normal range of motion.     Thoracic back: Normal.     Lumbar back: Tenderness and bony tenderness present. No swelling, edema, deformity, signs of trauma, lacerations or spasms. Normal range of motion. No scoliosis.     Right hip: Normal. No deformity, lacerations, tenderness, bony tenderness or crepitus. Normal range of motion. Normal strength.     Left hip: No  deformity, lacerations, tenderness, bony tenderness or crepitus. Normal range of motion. Normal strength.     Right upper leg: Normal. No swelling, edema, deformity, lacerations, tenderness or bony tenderness.     Left upper leg: Normal. No swelling, edema, deformity, lacerations, tenderness or bony tenderness.     Right knee: Normal.     Left knee: Normal.     Right lower leg: Normal. No swelling, deformity, lacerations, tenderness or bony tenderness. No edema.     Left lower leg: Normal. No swelling, deformity, lacerations, tenderness or bony tenderness. No edema.     Right ankle: Normal.     Left ankle: Normal.       Legs:     Comments: 0-1+/4 nonpitting edema and macular rash anterior right forearm dry no blisters skin intact; applied biofreeze  and xl lumbar thermacare patch over leg hip/lumbar area in clinic and patient reported prior to departure pain improved; SI joints bilaterally not TTP nor or paraspinals; patient reported worsening pain with lumbar left lateral bending, extension and flexion;  Patient able to touch toes  Lymphadenopathy:     Head:     Right side of head: No submental, submandibular, tonsillar or preauricular adenopathy.     Left side of head: No submental, submandibular, tonsillar or preauricular adenopathy.     Cervical: No cervical adenopathy.     Right cervical: No superficial cervical adenopathy.    Left cervical: No superficial cervical adenopathy.  Skin:    General: Skin is warm and dry.     Capillary Refill: Capillary refill takes less than 2 seconds.     Coloration: Skin is not ashen, cyanotic, jaundiced, mottled, pale or sallow.     Findings: Burn, erythema, signs of injury and rash present. No abrasion, abscess, acne, bruising, ecchymosis, laceration, lesion, petechiae or wound. Rash is macular. Rash is not crusting, nodular, papular, purpuric, pustular, scaling, urticarial or vesicular.     Nails: There is no clubbing.          Comments: Dry macular  erythema right anterior forearm no blisters skin intact 0-1+/4 nonpitting edema  Neurological:     General: No focal deficit present.     Mental Status: She is alert and oriented to person, place, and time. Mental status is at baseline. She is not disoriented.     GCS: GCS eye subscore is 4. GCS verbal subscore is 5. GCS motor subscore is 6.     Cranial Nerves: Cranial nerves are intact. No cranial nerve deficit, dysarthria or facial asymmetry.     Sensory: Sensation is intact. No sensory deficit.     Motor: Motor function is intact. No weakness, tremor, atrophy, abnormal muscle tone or seizure activity.     Coordination: Coordination is intact. Coordination normal.     Gait: Gait is intact. Gait normal.     Deep Tendon Reflexes: Reflexes normal.     Reflex Scores:      Brachioradialis reflexes are 1+ on the right side and 1+ on the left side.      Patellar reflexes are 2+ on the right side and 2+ on the left side.    Comments: Strength 5/5 bilaterally arms/legs; bilateral hand grasp equal 5/5; on/off exam table and in/out of chair without difficulty; gait sure and steady in clinic  Psychiatric:        Attention and Perception: Attention and perception normal. She is attentive.        Mood and Affect: Mood and affect normal.        Speech: Speech normal.        Behavior: Behavior normal. Behavior is cooperative.        Thought Content: Thought content normal.        Cognition and Memory: Cognition and memory normal.        Judgment: Judgment normal.           Assessment & Plan:  A-lumbar radicular pain acute and burn right forearm initial encounter  P- Ibuprofen and tylenol not helping.  Stop ibuprofen/advil/aleve/motrin/naproxen/naprosyn.  Start/has used diclofenac 75mg  po BID prn pain take with food to avoid gastric irritation.  #30 RF0 dispensed from [facility].  Avoid alcohol intake and driving while taking cyclobenazeprine/flexeril as drowsiness common side effect.  Slow position  changes as medication also lower blood pressure.  Avoid  prolonged static positions.  Discussed may take 2+ weeks to completely resolve.    Home stretches demonstrated to patient-e.g. Arm circles, walking up wall, chest stretches, neck AROM, chin tucks, knee to chest and rock side to side on back. Self massage or professional prn, foam roller use or tennis/racquetball.  Heat/cryotherapy 15 minutes QID prn.  Trial thermacare 1 applied and another given to patient for use tomorrow from clinic stock. Patient reported heat and biofreeze helping prior to departure from clinic prior to taking diclofenac.   Consider physical therapy referral if no improvement with prescribed therapy from [facility] and/or chiropractic care.  Ensure ergonomics correct desk at work avoid repetitive motions if possible/holding phone/laptop in hand use desk/stand and/or break up lifting items into smaller loads/weights.  Try to shift weight evenly between legs or changing leg position throughout shift/sitting and performing stretches during work breaks.  Patient was instructed to rest, ice, and ROM exercises.  Activity as tolerated.   Follow up if symptoms persist or worsen especially if loss of bowel/bladder control, arm/leg weakness and/or saddle paresthesias. Same day re-evaluation if these RED FLAGS occur with PCM/EHW/UC/ER. Discussed with patient and daughter in law I suspect pinched/irritated nerve not DVT at this time.  Exitcare handout on lumbar radiculopathy and sciatic rehab exercises printed and given to patient.  Demonstrated stretches with patient and daughter in law in clinic.  Discussed using tennis ball/massage also.  Patient to stop any stretches that worsen symptoms.   Discussed symptoms of blood clot/DVT with patient and daughter in law monitor for swelling, redness, lump, blood vessel distended/TTP.  Patient denied all of these symptoms.  Physical palpation of bilateral legs did not elicit any swelling or tender areas.  Spider  varicose veins noted lateral thighs but not distended or tender.  Follow up in 2 weeks for re-evaluation sooner if new or worsening symptoms.   Patient and daughter in law verbalized agreement and understanding of treatment plan and had no further questions at this time.  P2:  In[facility].  The burn is cleansed with first aid spray by RN Rolly Salter.  Electronic Rx to her pharmacy of choice silvadene 1% apply daily #50 RF0.  Patient to stop ibuprofen/advil/aleve/naproxen/naprosyn/motrin.  Given diclofenac 75mg  po BID prn pain #30 RF0 from [facility]. Avoid dehydration; drink water to keep urine pale yellow and clear.  May take tylenol 1000mg  po q6h prn breakthrough pain.   If swelling elevated right hand/arm.  Avoid scratching/itching.  Given aquaphor 4 UD from clinic stock today to apply until she is able to pick up silvadene cream from pharmacy.  If skin opens cover with protective bandage/dressing.  Monitor for red streaks up arm past elbow, fever greater than 100.34F, purulent discharge and seek re-evaluation sooner than 11 Mar if this occurs.  Discussed clinic closed tomorrow 10 Mar.  Exitcare handout on burn care printed and given to patient. Patient and daughter in law verbalized understanding, agreed with plan of care and had no further questions at this time.   Dispensed atorvastatin 20mg  po daily #90 from [facility] to patient from [facility] Formulary

## 2019-12-01 NOTE — Patient Instructions (Addendum)
Lumbosacral Radiculopathy Lumbosacral radiculopathy is a condition that involves the spinal nerves and nerve roots in the low back and bottom of the spine. The condition develops when these nerves and nerve roots move out of place or become inflamed and cause symptoms. What are the causes? This condition may be caused by:  Pressure from a disk that bulges out of place (herniated disk). A disk is a plate of soft cartilage that separates bones in the spine.  Disk changes that occur with age (disk degeneration).  A narrowing of the bones of the lower back (spinal stenosis).  A tumor.  An infection.  An injury that places sudden pressure on the disks that cushion the bones of your lower spine. What increases the risk? You are more likely to develop this condition if:  You are a female who is 30-50 years old.  You are a female who is 50-60 years old.  You use improper technique when lifting things.  You are overweight or live a sedentary lifestyle.  Your work requires frequent lifting.  You smoke.  You do repetitive activities that strain the spine. What are the signs or symptoms? Symptoms of this condition include:  Pain that goes down from your back into your legs (sciatica), usually on one side of the body. This is the most common symptom. The pain may be worse with sitting, coughing, or sneezing.  Pain and numbness in your legs.  Muscle weakness.  Tingling.  Loss of bladder control or bowel control. How is this diagnosed? This condition may be diagnosed based on:  Your symptoms and medical history.  A physical exam. If the pain is lasting, you may have tests, such as:  MRI scan.  X-ray.  CT scan.  A type of X-ray used to examine the spinal canal after injecting a dye into your spine (myelogram).  A test to measure how electrical impulses move through a nerve (nerve conduction study). How is this treated? Treatment may depend on the cause of the condition and  may include:  Working with a physical therapist.  Taking pain medicine.  Applying heat and ice to affected areas.  Doing stretches to improve flexibility.  Doing exercises to strengthen back muscles.  Having chiropractic spinal manipulation.  Using transcutaneous electrical nerve stimulation (TENS) therapy.  Getting a steroid injection in the spine. In some cases, no treatment is needed. If the condition is long-lasting (chronic), or if symptoms are severe, treatment may involve surgery or lifestyle changes, such as following a weight-loss plan. Follow these instructions at home: Activity  Avoid bending and other activities that make the problem worse.  Maintain a proper position when standing or sitting: ? When standing, keep your upper back and neck straight, with your shoulders pulled back. Avoid slouching. ? When sitting, keep your back straight and relax your shoulders. Do not round your shoulders or pull them backward.  Do not sit or stand in one place for long periods of time.  Take brief periods of rest throughout the day. This will reduce your pain. It is usually better to rest by lying down or standing, not sitting.  When you are resting for longer periods, mix in some mild activity or stretching between periods of rest. This will help to prevent stiffness and pain.  Get regular exercise. Ask your health care provider what activities are safe for you. If you were shown how to do any exercises or stretches, do them as directed by your health care provider.  Do   not lift anything that is heavier than 10 lb (4.5 kg) or the limit that you are told by your health care provider. Always use proper lifting technique, which includes: ? Bending your knees. ? Keeping the load close to your body. ? Avoiding twisting. Managing pain  If directed, put ice on the affected area: ? Put ice in a plastic bag. ? Place a towel between your skin and the bag. ? Leave the ice on for 20  minutes, 2-3 times a day.  If directed, apply heat to the affected area as often as told by your health care provider. Use the heat source that your health care provider recommends, such as a moist heat pack or a heating pad. ? Place a towel between your skin and the heat source. ? Leave the heat on for 20-30 minutes. ? Remove the heat if your skin turns bright red. This is especially important if you are unable to feel pain, heat, or cold. You may have a greater risk of getting burned.  Take over-the-counter and prescription medicines only as told by your health care provider. General instructions  Sleep on a firm mattress in a comfortable position. Try lying on your side with your knees slightly bent. If you lie on your back, put a pillow under your knees.  Do not drive or use heavy machinery while taking prescription pain medicine.  If your health care provider prescribed a diet or exercise program, follow it as directed.  Keep all follow-up visits as told by your health care provider. This is important. Contact a health care provider if:  Your pain does not improve over time, even when taking pain medicines. Get help right away if:  You develop severe pain.  Your pain suddenly gets worse.  You develop increasing weakness in your legs.  You lose the ability to control your bladder or bowel.  You have difficulty walking or balancing.  You have a fever. Summary  Lumbosacral radiculopathy is a condition that occurs when the spinal nerves and nerve roots in the lower part of the spine move out of place or become inflamed and cause symptoms.  Symptoms include pain, numbness, and tingling that go down from your back into your legs (sciatica), muscle weakness, and loss of bladder control or bowel control.  If directed, apply ice or heat to the affected area as told by your health care provider.  Follow instructions about activity, rest, and proper lifting technique. This  information is not intended to replace advice given to you by your health care provider. Make sure you discuss any questions you have with your health care provider. Document Revised: 08/29/2017 Document Reviewed: 08/29/2017 Elsevier Patient Education  2020 [employer] Sciatica Rehab Ask your health care provider which exercises are safe for you. Do exercises exactly as told by your health care provider and adjust them as directed. It is normal to feel mild stretching, pulling, tightness, or discomfort as you do these exercises. Stop right away if you feel sudden pain or your pain gets worse. Do not begin these exercises until told by your health care provider. Stretching and range-of-motion exercises These exercises warm up your muscles and joints and improve the movement and flexibility of your hips and back. These exercises also help to relieve pain, numbness, and tingling. Sciatic nerve glide 1. Sit in a chair with your head facing down toward your chest. Place your hands behind your back. Let your shoulders slump forward. 2. Slowly straighten one of your  legs while you tilt your head back as if you are looking toward the ceiling. Only straighten your leg as far as you can without making your symptoms worse. 3. Hold this position for __15________ seconds. 4. Slowly return to the starting position. 5. Repeat with your other leg. Repeat ____3______ times. Complete this exercise _____3_____ times a day. Knee to chest with hip adduction and internal rotation  1. Lie on your back on a firm surface with both legs straight. 2. Bend one of your knees and move it up toward your chest until you feel a gentle stretch in your lower back and buttock. Then, move your knee toward the shoulder that is on the opposite side from your leg. This is hip adduction and internal rotation. ? Hold your leg in this position by holding on to the front of your knee. 3. Hold this position for _____15_____  seconds. 4. Slowly return to the starting position. 5. Repeat with your other leg. Repeat _____3_____ times. Complete this exercise ____3______ times a day. Prone extension on elbows  1. Lie on your abdomen on a firm surface. A bed may be too soft for this exercise. 2. Prop yourself up on your elbows. 3. Use your arms to help lift your chest up until you feel a gentle stretch in your abdomen and your lower back. ? This will place some of your body weight on your elbows. If this is uncomfortable, try stacking pillows under your chest. ? Your hips should stay down, against the surface that you are lying on. Keep your hip and back muscles relaxed. 4. Hold this position for ____15______ seconds. 5. Slowly relax your upper body and return to the starting position. Repeat ____3______ times. Complete this exercise ____3______ times a day. Strengthening exercises These exercises build strength and endurance in your back. Endurance is the ability to use your muscles for a long time, even after they get tired. Pelvic tilt This exercise strengthens the muscles that lie deep in the abdomen. 1. Lie on your back on a firm surface. Bend your knees and keep your feet flat on the floor. 2. Tense your abdominal muscles. Tip your pelvis up toward the ceiling and flatten your lower back into the floor. ? To help with this exercise, you may place a small towel under your lower back and try to push your back into the towel. 3. Hold this position for ____15______ seconds. 4. Let your muscles relax completely before you repeat this exercise. Repeat ___3_______ times. Complete this exercise _____3_____ times a day. Alternating arm and leg raises  1. Get on your hands and knees on a firm surface. If you are on a hard floor, you may want to use padding, such as an exercise mat, to cushion your knees. 2. Line up your arms and legs. Your hands should be directly below your shoulders, and your knees should be directly  below your hips. 3. Lift your left leg behind you. At the same time, raise your right arm and straighten it in front of you. ? Do not lift your leg higher than your hip. ? Do not lift your arm higher than your shoulder. ? Keep your abdominal and back muscles tight. ? Keep your hips facing the ground. ? Do not arch your back. ? Keep your balance carefully, and do not hold your breath. 4. Hold this position for ____15____ seconds. 5. Slowly return to the starting position. 6. Repeat with your right leg and your left arm. Repeat _____3_____  times. Complete this exercise _____3_____ times a day. Posture and body mechanics Good posture and healthy body mechanics can help to relieve stress in your body's tissues and joints. Body mechanics refers to the movements and positions of your body while you do your daily activities. Posture is part of body mechanics. Good posture means:  Your spine is in its natural S-curve position (neutral).  Your shoulders are pulled back slightly.  Your head is not tipped forward. Follow these guidelines to improve your posture and body mechanics in your everyday activities. Standing   When standing, keep your spine neutral and your feet about hip width apart. Keep a slight bend in your knees. Your ears, shoulders, and hips should line up.  When you do a task in which you stand in one place for a long time, place one foot up on a stable object that is 2-4 inches (5-10 cm) high, such as a footstool. This helps keep your spine neutral. Sitting   When sitting, keep your spine neutral and keep your feet flat on the floor. Use a footrest, if necessary, and keep your thighs parallel to the floor. Avoid rounding your shoulders, and avoid tilting your head forward.  When working at a desk or a computer, keep your desk at a height where your hands are slightly lower than your elbows. Slide your chair under your desk so you are close enough to maintain good  posture.  When working at a computer, place your monitor at a height where you are looking straight ahead and you do not have to tilt your head forward or downward to look at the screen. Resting  When lying down and resting, avoid positions that are most painful for you.  If you have pain with activities such as sitting, bending, stooping, or squatting, lie in a position in which your body does not bend very much. For example, avoid curling up on your side with your arms and knees near your chest (fetal position).  If you have pain with activities such as standing for a long time or reaching with your arms, lie with your spine in a neutral position and bend your knees slightly. Try the following positions: ? Lying on your side with a pillow between your knees. ? Lying on your back with a pillow under your knees. Lifting   When lifting objects, keep your feet at least shoulder width apart and tighten your abdominal muscles.  Bend your knees and hips and keep your spine neutral. It is important to lift using the strength of your legs, not your back. Do not lock your knees straight out.  Always ask for help to lift heavy or awkward objects. This information is not intended to replace advice given to you by your health care provider. Make sure you discuss any questions you have with your health care provider. Document Revised: 01/02/2019 Document Reviewed: 10/02/2018 Elsevier Patient Education  2020 [employer]  Burn Care, Adult A burn is an injury to the skin or the tissues under the skin. There are three types of burns:  First degree. These burns may cause the skin to be red and slightly swollen.  Second degree. These burns are very painful and cause the skin to be very red. The skin may also leak fluid, look shiny, and develop blisters.  Third degree. These burns cause permanent damage. They either turn the skin white or black and make it look charred, dry, and leathery. Taking care  of your burn properly  can help to prevent pain and infection. It can also help the burn to heal more quickly. What are the risks? Complications from burns include:  Damage to the skin.  Reduced blood flow near the injury.  Dead tissue.  Scarring.  Problems with movement, if the burn happened near a joint or on the hands or feet. Severe burns can lead to problems that affect the whole body, such as:  Fluid loss.  Less blood circulating in the body.  Inability to maintain a normal core body temperature (thermoregulation).  Infection.  Shock.  Problems breathing. How to care for a first-degree burn Right after a burn:  Rinse or soak the burn under cool water until the pain stops. Do not put ice on your burn. This can cause more damage.  Lightly cover the burn with a sterile cloth (dressing). Burn care  Follow instructions from your health care provider about: ? How to clean and take care of the burn. ? When to change and remove the dressing.  Check your burn every day for signs of infection. Check for: ? More redness, swelling, or pain. ? Warmth. ? Pus or a bad smell. Medicine  Take over-the-counter and prescription medicines only as told by your health care provider.  If you were prescribed antibiotic medicine, take or apply it as told by your health care provider. Do not stop using the antibiotic even if your condition improves. General instructions  To prevent infection, do not put butter, oil, or other home remedies on your burn.  Do not rub your burn, even when you are cleaning it.  Protect your burn from the sun. How to care for a second-degree burn Right after a burn:  Rinse or soak the burn under cool water. Do this for several minutes. Do not put ice on your burn. This can cause more damage.  Lightly cover the burn with a sterile cloth (dressing). Burn care  Raise (elevate) the injured area above the level of your heart while sitting or lying  down.  Follow instructions from your health care provider about: ? How to clean and take care of the burn. ? When to change and remove the dressing.  Check your burn every day for signs of infection. Check for: ? More redness, swelling, or pain. ? Warmth. ? Pus or a bad smell. Medicine   Take over-the-counter and prescription medicines only as told by your health care provider.  If you were prescribed antibiotic medicine, take or apply it as told by your health care provider. Do not stop using the antibiotic even if your condition improves. General instructions  To prevent infection: ? Do not put butter, oil, or other home remedies on the burn. ? Do not scratch or pick at the burn. ? Do not break any blisters. ? Do not peel skin.  Do not rub your burn, even when you are cleaning it.  Protect your burn from the sun. How to care for a third-degree burn Right after a burn:  Lightly cover the burn with gauze.  Seek immediate medical attention. Burn care  Raise (elevate) the injured area above the level of your heart while sitting or lying down.  Drink enough fluid to keep your urine clear or pale yellow.  Rest as told by your health care provider. Do not participate in sports or other physical activities until your health care provider approves.  Follow instructions from your health care provider about: ? How to clean and take care of the  burn. ? When to change and remove the dressing.  Check your burn every day for signs of infection. Check for: ? More redness, swelling, or pain. ? Warmth. ? Pus or a bad smell. Medicine  Take over-the-counter and prescription medicines only as told by your health care provider.  If you were prescribed antibiotic medicine, take or apply it as told by your health care provider. Do not stop using the antibiotic even if your condition improves. General instructions  To prevent infection: ? Do not put butter, oil, or other home  remedies on the burn. ? Do not scratch or pick at the burn. ? Do not break any blisters. ? Do not peel skin. ? Do not rub your burn, even when you are cleaning it.  Protect your burn from the sun.  Keep all follow-up visits as told by your health care provider. This is important. Contact a health care provider if:  Your condition does not improve.  Your condition gets worse.  You have a fever.  Your burn changes in appearance or develops black or red spots.  Your burn feels warm to the touch.  Your pain is not controlled with medicine. Get help right away if:  You have redness, swelling, or pain at the site of the burn.  You have fluid, blood, or pus coming from your burn.  You have red streaks near the burn.  You have severe pain. This information is not intended to replace advice given to you by your health care provider. Make sure you discuss any questions you have with your health care provider. Document Revised: 08/23/2017 Document Reviewed: 02/28/2016 Elsevier Patient Education  2020 [employer]

## 2019-12-21 ENCOUNTER — Ambulatory Visit: Payer: No Typology Code available for payment source | Attending: Internal Medicine

## 2019-12-21 DIAGNOSIS — Z23 Encounter for immunization: Secondary | ICD-10-CM

## 2019-12-21 NOTE — Progress Notes (Signed)
   Covid-19 Vaccination Clinic  Name:  Monica Walker    MRN: [id number] DOB: 1956/02/17  12/21/2019  Monica Walker was observed post Covid-19 immunization for 15 minutes without incident. She was provided with Vaccine Information Sheet and instruction to access the V-Safe system.   Monica Walker was instructed to call 911 with any severe reactions post vaccine: Marland Kitchen Difficulty breathing  . Swelling of face and throat  . A fast heartbeat  . A bad rash all over body  . Dizziness and weakness   Immunizations Administered    Name Date Dose VIS Date Route   Pfizer COVID-19 Vaccine 12/21/2019  1:35 PM 0.3 mL 09/04/2019 Intramuscular   Manufacturer: [employer], [employer]   Lot: [id number]   NDC: [address]-8

## 2020-01-04 ENCOUNTER — Ambulatory Visit: Payer: Self-pay

## 2020-01-13 ENCOUNTER — Ambulatory Visit: Payer: No Typology Code available for payment source | Attending: Internal Medicine

## 2020-01-13 DIAGNOSIS — Z23 Encounter for immunization: Secondary | ICD-10-CM

## 2020-01-13 NOTE — Progress Notes (Signed)
   Covid-19 Vaccination Clinic  Name:  Monica Walker    MRN: [id number] DOB: 1956/09/24  01/13/2020  [name] was observed post Covid-19 immunization for 15 minutes without incident. She was provided with Vaccine Information Sheet and instruction to access the V-Safe system.   [name] was instructed to call 911 with any severe reactions post vaccine: Marland Kitchen Difficulty breathing  . Swelling of face and throat  . A fast heartbeat  . A bad rash all over body  . Dizziness and weakness   Immunizations Administered    Name Date Dose VIS Date Route   Pfizer COVID-19 Vaccine 01/13/2020  1:12 PM 0.3 mL 11/18/2018 Intramuscular   Manufacturer: [employer], [employer]   Lot: [id number]   NDC: [address]-5

## 2020-04-12 ENCOUNTER — Other Ambulatory Visit: Payer: Self-pay

## 2020-04-12 ENCOUNTER — Ambulatory Visit: Payer: Self-pay | Admitting: Registered Nurse

## 2020-04-12 VITALS — BP 120/74 | HR 84 | Temp 97.9°F | Resp 18

## 2020-04-12 DIAGNOSIS — I83893 Varicose veins of bilateral lower extremities with other complications: Secondary | ICD-10-CM

## 2020-04-12 DIAGNOSIS — M79605 Pain in left leg: Secondary | ICD-10-CM

## 2020-04-12 MED ORDER — ACETAMINOPHEN 500 MG PO TABS: 500.0000 mg | ORAL_TABLET | Freq: Four times a day (QID) | ORAL | 0 refills | Status: AC | PRN

## 2020-04-12 NOTE — Progress Notes (Signed)
Subjective:    Patient ID: Monica Walker, female    DOB: Feb 16, 1956, 64 y.o.   MRN: [id number]  64y/o caucasian female established patient english second language patient prefers daughter in law with her at appt.  Still having left leg pain from upper thigh to calf.  Has been wearing ace bandage/compression left leg and now notices left calf a lot smaller than right.  Last seen lumbar radiculopathy March 2021 and trial of diclofenac as tylenol and motrin were not helping.  Patient reported medication not really helping leg pain.  Typically puts most of her weight on right leg when standing stationary at work station.  Denied back pain today.  Her main concern left lateral leg pain and left lower leg pain/size not same as right now.  Not wearing bilateral compression hose for varicose veins as typically hurts  Daughter in law wondering if she has peripheral vascular disease and if she should see specialist for further evaluation lower legs.  PMHx HTN, type II DM, HLP, obesity, hypothyroidism  Is wearing new sneakers since last visit     Review of Systems  Constitutional: Negative for activity change, appetite change, chills, diaphoresis, fatigue and fever.  HENT: Negative for trouble swallowing and voice change.   Eyes: Negative for pain, discharge and itching.  Respiratory: Negative for cough, shortness of breath, wheezing and stridor.   Cardiovascular: Positive for leg swelling.  Gastrointestinal: Negative for abdominal pain, diarrhea, nausea and vomiting.  Endocrine: Negative for cold intolerance and heat intolerance.  Genitourinary: Negative for difficulty urinating.  Musculoskeletal: Positive for myalgias. Negative for arthralgias, back pain, gait problem, joint swelling, neck pain and neck stiffness.  Skin: Negative for color change, pallor, rash and wound.  Allergic/Immunologic: Positive for immunocompromised state. Negative for environmental allergies and food allergies.  Neurological:  Negative for dizziness, tremors, seizures, syncope, facial asymmetry, speech difficulty, weakness, light-headedness, numbness and headaches.  Hematological: Negative for adenopathy. Does not bruise/bleed easily.  Psychiatric/Behavioral: Negative for agitation, confusion and sleep disturbance.       Objective:   Physical Exam Vitals and nursing note reviewed.  Constitutional:      General: She is awake. She is not in acute distress.    Appearance: Normal appearance. She is well-developed and well-groomed. She is obese. She is not ill-appearing, toxic-appearing or diaphoretic.  HENT:     Head: Normocephalic and atraumatic.     Jaw: There is normal jaw occlusion.     Salivary Glands: Right salivary gland is not diffusely enlarged. Left salivary gland is not diffusely enlarged.     Right Ear: Hearing and external ear normal.     Left Ear: Hearing and external ear normal.     Nose: Nose normal.     Mouth/Throat:     Pharynx: Oropharynx is clear.  Eyes:     General: Lids are normal. Vision grossly intact. Gaze aligned appropriately. Allergic shiner present. No visual field deficit or scleral icterus.       Right eye: No discharge.        Left eye: No discharge.     Extraocular Movements: Extraocular movements intact.     Right eye: Normal extraocular motion and no nystagmus.     Left eye: Normal extraocular motion and no nystagmus.     Conjunctiva/sclera: Conjunctivae normal.     Pupils: Pupils are equal, round, and reactive to light.  Neck:     Thyroid: No thyromegaly.     Trachea: Trachea and phonation normal.  Cardiovascular:  Rate and Rhythm: Normal rate and regular rhythm.     Pulses:          Radial pulses are 2+ on the right side and 2+ on the left side.       Dorsalis pedis pulses are 1+ on the right side and 1+ on the left side.     Comments: Noted ace wrap lines on left lower extremity 2 inch width; diameter left 14 inches at calf and right 16 inches at largest  gastrocnemius bulk; wearing ankle cotton socks in sneakers Pulmonary:     Effort: Pulmonary effort is normal. No respiratory distress.     Breath sounds: Normal breath sounds and air entry. No stridor, decreased air movement or transmitted upper airway sounds. No decreased breath sounds, wheezing, rhonchi or rales.     Comments: Wearing cloth mask due to covid 19 pandemic; spoke full sentences without difficulty; no cough observed in exam room Abdominal:     Palpations: Abdomen is soft.  Musculoskeletal:        General: Swelling and tenderness present. No deformity or signs of injury. Normal range of motion.     Right shoulder: Normal.     Left shoulder: Normal.     Right elbow: Normal.     Left elbow: Normal.     Right hand: Normal.     Left hand: Normal.     Cervical back: Normal, normal range of motion and neck supple. No swelling, edema, deformity, erythema, signs of trauma, lacerations, rigidity, spasms, torticollis, tenderness or crepitus. No pain with movement. Normal range of motion.     Thoracic back: Normal. No swelling, edema, deformity, signs of trauma, lacerations, spasms, tenderness or bony tenderness. Normal range of motion. No scoliosis.     Lumbar back: Normal. No swelling, edema, deformity, signs of trauma, lacerations, spasms, tenderness or bony tenderness. Normal range of motion. No scoliosis.     Right hip: Normal.     Left hip: Normal.     Right knee: Crepitus present. No swelling, deformity, effusion, erythema, ecchymosis, lacerations or bony tenderness. Normal range of motion. No tenderness. No LCL laxity, MCL laxity, ACL laxity or PCL laxity. Normal alignment, normal meniscus and normal patellar mobility. Normal pulse.     Instability Tests: Anterior drawer test negative. Posterior drawer test negative. Anterior Lachman test negative.     Left knee: Crepitus present. No swelling, deformity, effusion, erythema, ecchymosis, lacerations or bony tenderness. Normal range of  motion. No tenderness. No LCL laxity, MCL laxity, ACL laxity or PCL laxity.Normal alignment, normal meniscus and normal patellar mobility. Normal pulse.     Instability Tests: Anterior drawer test negative. Posterior drawer test negative. Anterior Lachman test negative.     Right lower leg: Swelling present. No deformity, lacerations, tenderness or bony tenderness. 1+ Edema present.     Left lower leg: Swelling and tenderness present. No deformity, lacerations or bony tenderness. 1+ Edema present.     Right ankle: Swelling present. No deformity, ecchymosis or lacerations. No tenderness. Normal range of motion. Anterior drawer test negative. Normal pulse.     Right Achilles Tendon: Normal.     Left ankle: Swelling present. No deformity, ecchymosis or lacerations. No tenderness. Normal range of motion. Anterior drawer test negative. Normal pulse.     Left Achilles Tendon: Normal.       Legs:     Comments: On/off exam table and in/out of chair without difficulty; bilateral hand grasp equal 5/5; gait sure and steady in hallway  and clinic; mild patellar crepitus with flexion extension bilaterally; bilaterally negative anterior/posterior drawer sign/valgus/varus stress/Lachmann's/patellar displacement apprehension; patient cannot touch heel to buttock while supine on exam table foot 10 inches from buttock at max flexion knee/hip bilaterally equal  Lymphadenopathy:     Head:     Right side of head: No submental, submandibular, tonsillar or preauricular adenopathy.     Left side of head: No submental, submandibular, tonsillar or preauricular adenopathy.     Cervical: No cervical adenopathy.     Right cervical: No superficial cervical adenopathy.    Left cervical: No superficial cervical adenopathy.  Skin:    General: Skin is warm and dry.     Capillary Refill: Capillary refill takes less than 2 seconds.     Coloration: Skin is not ashen, cyanotic, jaundiced, mottled, pale or sallow.     Findings: No  abrasion, abscess, acne, bruising, burn, ecchymosis, erythema, signs of injury, laceration, lesion, petechiae, rash or wound.     Nails: There is no clubbing.  Neurological:     General: No focal deficit present.     Mental Status: She is alert and oriented to person, place, and time. Mental status is at baseline.     GCS: GCS eye subscore is 4. GCS verbal subscore is 5. GCS motor subscore is 6.     Cranial Nerves: Cranial nerves are intact. No cranial nerve deficit, dysarthria or facial asymmetry.     Sensory: Sensation is intact. No sensory deficit.     Motor: Motor function is intact. No weakness, tremor, atrophy, abnormal muscle tone or seizure activity.     Coordination: Coordination is intact. Coordination normal.     Gait: Gait is intact. Gait normal.  Psychiatric:        Attention and Perception: Attention and perception normal.        Mood and Affect: Mood and affect normal.        Speech: Speech normal.        Behavior: Behavior normal. Behavior is cooperative.        Thought Content: Thought content normal.        Cognition and Memory: Cognition and memory normal.        Judgment: Judgment normal.           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  Elevated legs when sitting on break and after work at home.  Consider restart diclofenac 75mg  DR po BID prn pain or continue tylenol 1000mg  po QID prn pain due to hypertension.  I recommend weight loss.  Stretches TID per [facility] handouts IT Band syndrome rehab exercises, peripheral vascular disease, varicose veins, how to use compression stockings printed and given to daughter in law.  I recommend knee high or thigh high compression stockings or to wear leggings with spandex due to varicose veins from thighs to calves.  Discussed  legs are swollen and that water in tissues/stretching will cause discomfort also.  Noted that compression is working to decrease leg swelling left calf (ace bandage) but spandex clothing or socks would probably be more comfortable.  PVD/PAD could be contributing to patient pain also consider vascular consult.  Patient already on statin.   Discussed ABI/ultrasound may be performed by vascular clinic.   RN Rolly SalterHaley printed list of [facility] network providers for patient to choose provider.  Daughter  in law called office to schedule for patient and they required a referral.  She notified clinic staff approximately an hour after appt.  Discussed with her RN Rolly Salter would contact their office, fax this visit note to network vascular provider of patient choice for referral initiation.  [facility] does not require referral from [facility].  Consider formal PT/orthopedics if no improvement with plan of care for IT band syndrome symptoms.  Patient and daughter in law verbalized understanding information/instructions, agreed with plan of care and had no further questions at this time.

## 2020-04-12 NOTE — Patient Instructions (Addendum)
Consider vascular evaluation/physical therapy Elevate legs when on break and after work Wear compression hose every day either knee or thigh high or leggings  How to Use Compression Stockings Compression stockings are elastic socks that squeeze the legs. They help increase blood flow (circulation) to the legs, decrease swelling in the legs, and reduce the chance of developing blood clots in the lower legs. Compression stockings are often used by people who:  Are recovering from surgery.  Have poor circulation in their legs.  Tend to get blood clots in their legs.  Have bulging (varicose) veins.  Sit or stay in bed for long periods of time. Follow instructions from your health care provider about how and when to wear your compression stockings. How to wear compression stockings Before you put on your compression stockings:  Make sure that they are the correct size and degree of compression. If you do not know your size or required grade of compression, ask your health care provider and follow the manufacturer's instructions that come with the stockings.  Make sure that they are clean, dry, and in good condition.  Check them for rips and tears. Do not put them on if they are ripped or torn. Put your stockings on first thing in the morning, before you get out of bed. Keep them on for as long as your health care provider advises. When you are wearing your stockings:  Keep them as smooth as possible. Do not allow them to bunch up. It is especially important to prevent the stockings from bunching up around your toes or behind your knees.  Do not roll the stockings downward and leave them rolled down. This can decrease blood flow to your leg.  Change them right away if they become wet or dirty. When you take off your stockings, inspect your legs and feet. Check for:  Open sores.  Red spots.  Swelling. General tips  Do not stop wearing compression stockings without talking to your  health care provider first.  Wash your stockings every day with mild detergent in cold or warm water. Do not use bleach. Air-dry your stockings or dry them in a clothes dryer on low heat. It may be helpful to have two pairs so that you have a pair to wear while the other is being washed.  Replace your stockings every 3-6 months.  If skin moisturizing is part of your treatment plan, apply lotion or cream at night so that your skin will be dry when you put on the stockings in the morning. It is harder to put the stockings on when you have lotion on your legs or feet.  Wear nonskid shoes or slip-resistant socks when walking while wearing compression stockings. Contact a health care provider and remove your stockings if you have:  A feeling of pins and needles in your feet or legs.  Open sores, red spots, or other skin changes on your feet or legs.  Swelling or pain that gets worse. Get help right away if you have:  Numbness or tingling in your lower legs that does not get better right after you take the stockings off.  Toes or feet that are unusually cold or turn a bluish color.  A warm or red area on your leg.  New swelling or soreness in your leg.  Shortness of breath.  Chest pain.  A fast or irregular heartbeat.  Light-headedness.  Dizziness. Summary  Compression stockings are elastic socks that squeeze the legs.  They help increase blood  flow (circulation) to the legs, decrease swelling in the legs, and reduce the chance of developing blood clots in the lower legs.  Follow instructions from your health care provider about how and when to wear your compression stockings.  Do not stop wearing your compression stockings without talking to your health care provider first. This information is not intended to replace advice given to you by your health care provider. Make sure you discuss any questions you have with your health care provider. Document Revised: 09/12/2017  Document Reviewed: 09/12/2017 Elsevier Patient Education  2020 [employer] Varicose Veins Varicose veins are veins that have become enlarged, bulged, and twisted. They most often appear in the legs. What are the causes? This condition is caused by damage to the valves in the vein. These valves help blood return to your heart. When they are damaged and they stop working properly, blood may flow backward and back up in the veins near the skin, causing the veins to get larger and appear twisted. The condition can result from any issue that causes blood to back up, like pregnancy, prolonged standing, or obesity. What increases the risk? This condition is more likely to develop in people who are:  On their feet a lot.  Pregnant.  Overweight. What are the signs or symptoms? Symptoms of this condition include:  Bulging, twisted, and bluish veins.  A feeling of heaviness. This may be worse at the end of the day.  Leg pain. This may be worse at the end of the day.  Swelling in the leg.  Changes in skin color over the veins. How is this diagnosed? This condition may be diagnosed based on your symptoms, a physical exam, and an ultrasound test. How is this treated? Treatment for this condition may involve:  Avoiding sitting or standing in one position for long periods of time.  Wearing compression stockings. These stockings help to prevent blood clots and reduce swelling in the legs.  Raising (elevating) the legs when resting.  Losing weight.  Exercising regularly. If you have persistent symptoms or want to improve the way your varicose veins look, you may choose to have a procedure to close the varicose veins off or to remove them. Treatments to close off the veins include:  Sclerotherapy. In this treatment, a solution is injected into a vein to close it off.  Laser treatment. In this treatment, the vein is heated with a laser to close it off.  Radiofrequency vein ablation. In  this treatment, an electrical current produced by radio waves is used to close off the vein. Treatments to remove the veins include:  Phlebectomy. In this treatment, the veins are removed through small incisions made over the veins.  Vein ligation and stripping. In this treatment, incisions are made over the veins. The veins are then removed after being tied (ligated) with stitches (sutures). Follow these instructions at home: Activity  Walk as much as possible. Walking increases blood flow. This helps blood return to the heart and takes pressure off your veins. It also increases your cardiovascular strength.  Follow your health care provider's instructions about exercising.  Do not stand or sit in one position for a long period of time.  Do not sit with your legs crossed.  Rest with your legs raised during the day. General instructions   Follow any diet instructions given to you by your health care provider.  Wear compression stockings as directed by your health care provider. Do not wear other kinds of tight  clothing around your legs, pelvis, or waist.  Elevate your legs at night to above the level of your heart.  If you get a cut in the skin over the varicose vein and the vein bleeds: ? Lie down with your leg raised. ? Apply firm pressure to the cut with a clean cloth until the bleeding stops. ? Place a bandage (dressing) on the cut. Contact a health care provider if:  The skin around your varicose veins starts to break down.  You have pain, redness, tenderness, or hard swelling over a vein.  You are uncomfortable because of pain.  You get a cut in the skin over a varicose vein and it will not stop bleeding. Summary  Varicose veins are veins that have become enlarged, bulged, and twisted. They most often appear in the legs.  This condition is caused by damage to the valves in the vein. These valves help blood return to your heart.  Treatment for this condition  includes frequent movements, wearing compression stockings, losing weight, and exercising regularly. In some cases, procedures are done to close off or remove the veins.  Treatment for this condition may include wearing compression stockings, elevating the legs, losing weight, and engaging in regular activity. In some cases, procedures are done to close off or remove the veins. This information is not intended to replace advice given to you by your health care provider. Make sure you discuss any questions you have with your health care provider. Document Revised: 11/06/2018 Document Reviewed: 10/03/2016 Elsevier Patient Education  2020 [employer] Peripheral Vascular Disease Peripheral vascular disease (PVD) is a disease of the blood vessels. A simple term for PVD is poor circulation. In most cases, PVD narrows the blood vessels that carry blood from your heart to the rest of your body. This can result in a decreased supply of blood to your arms, legs, and internal organs, like your stomach or kidneys. However, it most often affects a person's lower legs and feet. There are two types of PVD.  Organic PVD. This is the more common type. It is caused by damage to the structure of blood vessels.  Functional PVD. This is caused by conditions that make blood vessels contract and tighten (spasm). Without treatment, PVD tends to get worse over time. PVD can also lead to acute limb ischemia. This is when an arm or leg suddenly has trouble getting enough blood. This is a medical emergency. What are the causes?  Each type of PVD has many different causes. The most common cause of PVD is buildup of a fatty material (plaque) inside your arteries (atherosclerosis). Small amounts of plaque can break off from the walls of the blood vessels and become lodged in a smaller artery. This blocks blood flow and can cause acute limb ischemia. Other common causes of PVD include:  Blood clots that form inside of blood  vessels.  Injuries to blood vessels.  Diseases that cause inflammation of blood vessels or cause blood vessel spasms.  Health behaviors and health history that increase your risk of developing PVD. What increases the risk? You are more likely to develop this condition if:  You have a family history of PVD.  You have certain medical conditions, including: ? High cholesterol. ? Diabetes. ? High blood pressure (hypertension). ? Coronary heart disease. ? Past problems with blood clots. ? Past injury, such as burns or a broken bone. These may have damaged blood vessels in your limbs. ? Buerger disease. This is caused by  inflamed blood vessels in your hands and feet. ? Some forms of arthritis. ? Rare birth defects that affect the arteries in your legs. ? Kidney disease.  You use tobacco or smoke.  You do not get enough exercise.  You are obese.  You are age 95 or older. What are the signs or symptoms? This condition may cause different symptoms. Your symptoms depend on what part of your body is not getting enough blood. Some common signs and symptoms include:  Cramps in your lower legs. This may be a symptom of poor leg circulation (claudication).  Pain and weakness in your legs. This happens while you are physically active but goes away when you rest (intermittent claudication).  Leg pain when at rest.  Leg numbness, tingling, or weakness.  Coldness in a leg or foot, especially when compared with the other leg.  Skin or hair changes. These can include: ? Hair loss. ? Shiny skin. ? Pale or bluish skin. ? Thick toenails.  Inability to get or maintain an erection (erectile dysfunction).  Fatigue. People with PVD are more likely to develop ulcers and sores on their toes, feet, or legs. These may take longer than normal to heal. How is this diagnosed? This condition is diagnosed based on:  Your signs and symptoms.  A physical exam and your medical history.  Other  tests to find out what is causing your PVD and to determine its severity. Tests may include: ? Blood pressure recordings from your arms and legs and measurements of the strength of your pulses (pulse volume recordings). ? Imaging studies using sound waves to take pictures of the blood flow through your blood vessels (Doppler ultrasound). ? Injecting a dye into your blood vessels before having imaging studies using:  X-rays (angiogram or arteriogram).  Computer-generated X-rays (CT angiogram).  A powerful electromagnetic field and a computer (magnetic resonance angiogram or MRA). How is this treated? Treatment for PVD depends on the cause of your condition and how severe your symptoms are. It also depends on your age. Underlying causes need to be treated and controlled. These include long-term (chronic) conditions, such as diabetes, high cholesterol, and high blood pressure. Treatment includes:  Lifestyle changes, such as: ? Quitting smoking. ? Exercising regularly. ? Following a low-fat, low-cholesterol diet.  Taking medicines, such as: ? Blood thinners to prevent blood clots. ? Medicines to improve blood flow. ? Medicines to improve your blood cholesterol levels.  Surgical procedures, such as: ? A procedure that uses an inflated balloon to open a blocked artery and improve blood flow (angioplasty). ? A procedure to put in a wire mesh tube to keep a blocked artery open (stent implant). ? Surgery to reroute blood flow around a blocked artery (peripheral bypass surgery). ? Surgery to remove dead tissue from an infected wound on the affected limb. ? Amputation. This is surgical removal of the affected limb. It may be necessary in cases of acute limb ischemia where there has been no improvement through medical or surgical treatments. Follow these instructions at home: Lifestyle  Do not use any products that contain nicotine or tobacco, such as cigarettes and e-cigarettes. If you need help  quitting, ask your health care provider.  Lose weight if you are overweight, and maintain a healthy weight as discussed by your health care provider.  Eat a diet that is low in fat and cholesterol. If you need help, ask your health care provider.  Exercise regularly. Ask your health care provider to suggest some good  activities for you. General instructions  Take over-the-counter and prescription medicines only as told by your health care provider.  Take good care of your feet: ? Wear comfortable shoes that fit well. ? Check your feet often for any cuts or sores.  Keep all follow-up visits as told by your health care provider. This is important. Contact a health care provider if:  You have cramps in your legs while walking.  You have leg pain when you are at rest.  You have coldness in a leg or foot.  Your skin changes.  You have erectile dysfunction.  You have cuts or sores on your feet that are not healing. Get help right away if:  Your arm or leg turns cold, numb, and blue.  Your arms or legs become red, warm, swollen, painful, or numb.  You have chest pain or trouble breathing.  You suddenly have weakness in your face, arm, or leg.  You become very confused or lose the ability to speak.  You suddenly have a very bad headache or lose your vision. Summary  Peripheral vascular disease (PVD) is a disease of the blood vessels.  In most cases, PVD narrows the blood vessels that carry blood from your heart to the rest of your body.  PVD may cause different symptoms. Your symptoms depend on what part of your body is not getting enough blood.  Treatment for PVD depends on the cause of your condition and how severe your symptoms are. This information is not intended to replace advice given to you by your health care provider. Make sure you discuss any questions you have with your health care provider. Document Revised: 08/23/2017 Document Reviewed: 10/18/2016 Elsevier  Patient Education  2020 [employer]. Iliotibial Band Syndrome Rehab Ask your health care provider which exercises are safe for you. Do exercises exactly as told by your health care provider and adjust them as directed. It is normal to feel mild stretching, pulling, tightness, or discomfort as you do these exercises. Stop right away if you feel sudden pain or your pain gets significantly worse. Do not begin these exercises until told by your health care provider. Stretching and range-of-motion exercises These exercises warm up your muscles and joints and improve the movement and flexibility of your hip and pelvis. Quadriceps stretch, prone  1. Lie on your abdomen on a firm surface, such as a bed or padded floor (prone position). 2. Bend your left / right knee and reach back to hold your ankle or pant leg. If you cannot reach your ankle or pant leg, loop a belt around your foot and grab the belt instead. 3. Gently pull your heel toward your buttocks. Your knee should not slide out to the side. You should feel a stretch in the front of your thigh and knee (quadriceps). 4. Hold this position for ____15______ seconds. Repeat ___3_______ times. Complete this exercise _____3_____ times a day. Iliotibial band stretch An iliotibial band is a strong band of muscle tissue that runs from the outer side of your hip to the outer side of your thigh and knee. 1. Lie on your side with your left / right leg in the top position. 2. Bend both of your knees and grab your left / right ankle. Stretch out your bottom arm to help you balance. 3. Slowly bring your top knee back so your thigh goes behind your trunk. 4. Slowly lower your top leg toward the floor until you feel a gentle stretch on the outside of  your left / right hip and thigh. If you do not feel a stretch and your knee will not fall farther, place the heel of your other foot on top of your knee and pull your knee down toward the floor with your  foot. 5. Hold this position for _____15_____ seconds. Repeat ______3____ times. Complete this exercise ____3______ times a day. Strengthening exercises These exercises build strength and endurance in your hip and pelvis. Endurance is the ability to use your muscles for a long time, even after they get tired. Straight leg raises, side-lying This exercise strengthens the muscles that rotate the leg at the hip and move it away from your body (hip abductors). 1. Lie on your side with your left / right leg in the top position. Lie so your head, shoulder, hip, and knee line up. You may bend your bottom knee to help you balance. 2. Roll your hips slightly forward so your hips are stacked directly over each other and your left / right knee is facing forward. 3. Tense the muscles in your outer thigh and lift your top leg 4-6 inches (10-15 cm). 4. Hold this position for ____15______ seconds. 5. Slowly return to the starting position. Let your muscles relax completely before doing another repetition. Repeat ____3______ times. Complete this exercise __3________ times a day. Leg raises, prone This exercise strengthens the muscles that move the hips (hip extensors). 1. Lie on your abdomen on your bed or a firm surface. You can put a pillow under your hips if that is more comfortable for your lower back. 2. Bend your left / right knee so your foot is straight up in the air. 3. Squeeze your buttocks muscles and lift your left / right thigh off the bed. Do not let your back arch. 4. Tense your thigh muscle as hard as you can without increasing any knee pain. 5. Hold this position for ____15______ seconds. 6. Slowly lower your leg to the starting position and allow it to relax completely. Repeat _____3_____ times. Complete this exercise _____3_____ times a day. Hip hike 1. Stand sideways on a bottom step. Stand on your left / right leg with your other foot unsupported next to the step. You can hold on to the  railing or wall for balance if needed. 2. Keep your knees straight and your torso square. Then lift your left / right hip up toward the ceiling. 3. Slowly let your left / right hip lower toward the floor, past the starting position. Your foot should get closer to the floor. Do not lean or bend your knees. Repeat ___3_____ times. Complete this exercise _____3_____ times a day. This information is not intended to replace advice given to you by your health care provider. Make sure you discuss any questions you have with your health care provider. Document Revised: 01/01/2019 Document Reviewed: 07/02/2018 Elsevier Patient Education  2020 [employer]. Iliotibial Band Syndrome  Iliotibial band syndrome (ITBS) is a condition that often causes knee pain. It can also cause pain in the outside of your hip, thigh, and knee. The iliotibial band is a strip of tissue that runs from the outside of your hip and down your thigh to the outside of your knee. Repeatedly bending and straightening your knee can irritate the iliotibial band. What are the causes? This condition is caused by inflammation and irritation from the friction of the iliotibial band moving over the thigh bone (femur) when you repeatedly bend and straighten your knee. What increases the risk? This  condition is more likely to develop in people who:  Frequently change elevation during their workouts.  Run very long distances.  Recently increased the length or intensity of their workouts.  Run downhill often, or just started running downhill.  Ride a bike very far or often. You may also be at greater risk if you start a new workout routine without first warming up or if you have a job that requires you to bend, squat, or climb frequently. What are the signs or symptoms? Symptoms of this condition include:  Pain along the outside of your knee that may be worse with activity, especially running or going up and down stairs.  A "snapping"  sensation over your knee.  Swelling on the outside of your knee.  Pain or a feeling of tightness in your hip. How is this diagnosed? This condition is diagnosed based on your symptoms, medical history, and physical exam. You may also see a health care provider who specializes in reducing pain and increasing mobility (physical therapist). A physical therapist may do an exam to check your balance, movement, and way of walking or running (gait) to see whether the way you move could contribute to your injury. You may also have tests to measure your strength, flexibility, and range of motion. How is this treated? Treatment for this condition includes:  Resting and limiting exercise.  Returning to activities gradually.  Doing range-of-motion and strengthening exercises (physical therapy) as told by your health care provider.  Including low-impact activities, such as swimming, in your exercise routine. Follow these instructions at home:  If directed, apply ice to the injured area. ? Put ice in a plastic bag. ? Place a towel between your skin and the bag. ? Leave the ice on for 20 minutes, 2-3 times per day.  Return to your normal activities as told by your health care provider. Ask your health care provider what activities are safe for you.  Keep all follow-up visits with your health care provider. This is important. Contact a health care provider if:  Your pain does not improve or gets worse despite treatment. This information is not intended to replace advice given to you by your health care provider. Make sure you discuss any questions you have with your health care provider. Document Revised: 08/23/2017 Document Reviewed: 10/12/2016 Elsevier Patient Education  2020 [employer].

## 2020-04-13 ENCOUNTER — Encounter: Payer: Self-pay | Admitting: Registered Nurse

## 2020-04-14 ENCOUNTER — Encounter: Payer: Self-pay | Admitting: *Deleted

## 2020-05-18 DIAGNOSIS — I8390 Asymptomatic varicose veins of unspecified lower extremity: Secondary | ICD-10-CM | POA: Insufficient documentation

## 2020-08-06 ENCOUNTER — Ambulatory Visit: Payer: No Typology Code available for payment source | Attending: Internal Medicine

## 2020-08-06 DIAGNOSIS — Z23 Encounter for immunization: Secondary | ICD-10-CM

## 2020-08-06 NOTE — Progress Notes (Signed)
   [facility]  Name:  Monica Walker    MRN: [id number] DOB: 04/07/1956  08/06/2020  Monica Walker was observed post Covid-19 immunization for 15 minutes without incident. She was provided with Vaccine Information Sheet and instruction to access the V-Safe system.   Monica Walker was instructed to call 911 with any severe reactions post vaccine: Marland Kitchen Difficulty breathing  . Swelling of face and throat  . A fast heartbeat  . A bad rash all over body  . Dizziness and weakness   Immunizations Administered    Name Date Dose VIS Date Route   Pfizer COVID-19 Vaccine 08/06/2020 11:22 AM 0.3 mL 07/13/2020 Intramuscular   Manufacturer: [employer], [employer]   Lot: [id number]   NDC: [address]-6

## 2021-05-06 ENCOUNTER — Telehealth: Payer: Self-pay | Admitting: Registered Nurse

## 2021-05-06 ENCOUNTER — Encounter: Payer: Self-pay | Admitting: Registered Nurse

## 2021-05-06 NOTE — Telephone Encounter (Signed)
[employer] Tim notified NP that daughter reported patient tested positive for covid at urgent care today  05/06/21 Day 0  5 Day quarantine per CDC guidelines symptoms must be improving/fever free x 24 hours before returning to work onsite with strict mask wear through Day 10 05/16/21 patient asymptomatic  Day 5 05/11/21  RTW 05/12/21 estimated.  Patient on paxlovid has sore throat otherwise feeling okay.  Strict mask wear at work and when around others x 10 days.  May eat in meditation room across from clinic, outside tables or in car during strict mask wear. Reviewed possible Covid symptoms including cough, shortness of breath with exertion or at rest, runny nose, congestion, sinus pain/pressure, sore throat, fever/chills, body aches, fatigue, loss of taste/smell, GI symptoms of nausea/vomiting/diarrhea.  Patient to isolate in own room and if possible use only one bathroom if living with others in home.  Wear mask when out of room to help prevent spread to others in household.  Sanitize high touch surfaces with lysol/chlorox/bleach spray or wipes daily as viruses are known to live on surfaces from 24 hours to days.  Daughter reported house was sanitized today.  Mother isolating in own room/bathroom and wearing mask if out of room around other family members.  Discussed with daughter she can contact me at this number [phone] whenever clinic closed if questions or concerns until RN Rolly Salter returns to clinic on Monday 05/08/21 [phone].   Daughter verbalized understanding and agreement with plan of care. No further questions/concerns at this time. Pt reminded to contact clinic with any changes in symptoms or questions/concerns. HR notified patient to quarantine x 5 days estimated RTW Day 6 8/19; strict mask wear x 10 days 05/16/21 and no eating in employee lunch room x 10 days.

## 2021-05-09 NOTE — Telephone Encounter (Signed)
Spoke with pt's daughter in law by phone. She reports pt is feeling well now. Still has some voice hoarseness but all other sx resolved. Continuing isolation at home with RTW planned for 8/19.

## 2021-05-09 NOTE — Telephone Encounter (Signed)
Reviewed RN Rolly Salter note symptoms improving continue quarantine through 8/18 with expected RTW 05/12/21 and strict mask wear through 05/16/21 and no eating in employee lunch room.  HR notified by RN Rolly Salter.

## 2021-05-12 NOTE — Telephone Encounter (Signed)
Daughter in law Denyse Dago confirms pt did RTW today 8/19 as expected. Feeling well, no sx. Strict mask use thru 8/23.

## 2021-05-13 NOTE — Telephone Encounter (Signed)
Feeling well masking around other family members in home no questions or concerns.

## 2021-05-23 NOTE — Telephone Encounter (Signed)
Son seen in warehouse stated mother doing well no concerns.  Encounter closed.

## 2022-01-30 DIAGNOSIS — E559 Vitamin D deficiency, unspecified: Secondary | ICD-10-CM | POA: Insufficient documentation

## 2022-12-13 ENCOUNTER — Encounter: Payer: Self-pay | Admitting: Registered Nurse

## 2022-12-13 ENCOUNTER — Telehealth: Payer: Self-pay | Admitting: Registered Nurse

## 2022-12-13 DIAGNOSIS — Z0000 Encounter for general adult medical examination without abnormal findings: Secondary | ICD-10-CM

## 2022-12-13 NOTE — Telephone Encounter (Signed)
Patient had appt with PCM Hgab1c 7 Last Filed Vital Signs Vital Sign Reading Time Taken Comments  Blood Pressure 131/70 07/18/2022 8:09 AM EDT    Pulse 64 07/18/2022 8:09 AM EDT    Temperature 36.3 C (97.4 F) 07/18/2022 8:09 AM EDT    Respiratory Rate - -    Oxygen Saturation 97% 07/18/2022 8:09 AM EDT    Inhaled Oxygen Concentration - -    Weight 107.2 kg (236 lb 6.4 oz) 07/18/2022 8:09 AM EDT    Height 172.7 cm (5\' 8" ) 07/18/2022 8:09 AM EDT    Body Mass Index 35.94 07/18/2022 8:09 AM EDT    Follow up executive panel, Hgba1c and vitamin D fasting ordered for patient.

## 2023-01-17 ENCOUNTER — Other Ambulatory Visit: Payer: Self-pay | Admitting: Occupational Medicine

## 2023-01-17 DIAGNOSIS — Z0000 Encounter for general adult medical examination without abnormal findings: Secondary | ICD-10-CM

## 2023-01-17 NOTE — Progress Notes (Signed)
Lab drawn tolerated well no issues noted.   

## 2023-01-20 ENCOUNTER — Other Ambulatory Visit: Payer: Self-pay | Admitting: Registered Nurse

## 2023-01-20 DIAGNOSIS — E1100 Type 2 diabetes mellitus with hyperosmolarity without nonketotic hyperglycemic-hyperosmolar coma (NKHHC): Secondary | ICD-10-CM

## 2023-01-20 DIAGNOSIS — R7989 Other specified abnormal findings of blood chemistry: Secondary | ICD-10-CM

## 2023-01-20 MED ORDER — CHOLECALCIFEROL 1.25 MG (50000 UT) PO TABS: 1.0000 | ORAL_TABLET | ORAL | 1 refills | Status: AC

## 2023-01-23 ENCOUNTER — Ambulatory Visit: Payer: Self-pay | Admitting: Occupational Medicine

## 2023-01-23 VITALS — BP 129/70 | Ht 65.5 in | Wt 231.0 lb

## 2023-01-23 DIAGNOSIS — Z0000 Encounter for general adult medical examination without abnormal findings: Secondary | ICD-10-CM

## 2023-01-23 NOTE — Progress Notes (Signed)
Be well insurance premium discount evaluation: Met  Epic reviewed by RN Bess Kinds and transcribed labs.  Tobacco attestation signed. [employer] formed signed. Forms placed in the chart.   Patient daughter given handouts for [employer] and discount drugs list,MyChart, Tele doc setup, Tele doc Behavioral, [place] counseling and [place] counseling will translate to the patient.  Daughter to translate to patient=What to do for infectious illness protocol. Given handout for list of medications that can be filled at [facility]. Given Clinic hours and Clinic Email.  Follow up lab work appt schedule, dietitian link sent to daughter, and daughter to setup MyChart know she can come to me with any questions.

## 2023-01-24 ENCOUNTER — Encounter: Payer: Self-pay | Admitting: Registered Nurse

## 2023-01-24 ENCOUNTER — Telehealth: Payer: Self-pay | Admitting: Registered Nurse

## 2023-01-24 DIAGNOSIS — E1169 Type 2 diabetes mellitus with other specified complication: Secondary | ICD-10-CM

## 2023-01-24 DIAGNOSIS — E039 Hypothyroidism, unspecified: Secondary | ICD-10-CM

## 2023-01-24 NOTE — Telephone Encounter (Signed)
Patient last filled [facility] 09/06/2022 90 day supply levothyroxine po daily #90 RF2 and atorvastatin 20mg  po daily #90 RF2 dispensed to patient today from [facility].  Labs stable recheck lipids and  TSH in  1 year.    Latest Reference Range & Units 07/04/17 08:40 01/17/23 08:56  Sodium 134 - 144 mmol/L 138 139  Potassium 3.5 - 5.2 mmol/L 3.9 4.1  Chloride 96 - 106 mmol/L 100 101  Glucose 70 - 99 mg/dL 782 (H) 956 (H)  BUN 8 - 27 mg/dL 17 26  Creatinine 2.13 - 1.00 mg/dL 0.86 5.78  Calcium 8.7 - 10.3 mg/dL 9.4 9.9  BUN/Creatinine Ratio 12 - 28  19 26   eGFR >59 mL/min/1.73  63  Phosphorus 3.0 - 4.3 mg/dL 3.0 2.9 (L)  Alkaline Phosphatase 44 - 121 IU/L 64 78  Albumin 3.9 - 4.9 g/dL 4.5 4.4  Albumin/Globulin Ratio 1.2 - 2.2  1.8 1.8  Uric Acid 3.0 - 7.2 mg/dL 5.1 4.9  AST 0 - 40 IU/L 14 18  ALT 0 - 32 IU/L 14 22  Total Protein 6.0 - 8.5 g/dL 7.0 6.9  Total Bilirubin 0.0 - 1.2 mg/dL 0.5 0.5  GGT 0 - 60 IU/L 15 12  GFR, Est Non African American >59 mL/min/1.73 72   GFR, Est African American >59 mL/min/1.73 83   Estimated CHD Risk 0.0 - 1.0 times avg.  < 0.5  < 0.5  LDH 119 - 226 IU/L 235 (H) 215  Total CHOL/HDL Ratio 0.0 - 4.4 ratio 2.6 2.5  Cholesterol, Total 100 - 199 mg/dL 469 629  HDL Cholesterol >39 mg/dL 69 72  LDL (calc) 0 - 99 mg/dL 81   Triglycerides 0 - 149 mg/dL 528 413  VLDL Cholesterol Cal 5 - 40 mg/dL 27 20  LDL Chol Calc (NIH) 0 - 99 mg/dL  85  Iron 27 - 244 ug/dL 010 272  Vitamin D, 53-GUYQIHK 30.0 - 100.0 ng/mL  22.7 (L)  Globulin, Total 1.5 - 4.5 g/dL 2.5 2.5  WBC 3.4 - 74.2 x10E3/uL 6.3 7.0  RBC 3.77 - 5.28 x10E6/uL 4.65 4.49  Hemoglobin 11.1 - 15.9 g/dL 59.5 63.8  HCT 75.6 - 43.3 % 40.6 40.6  MCV 79 - 97 fL 87 90  MCH 26.6 - 33.0 pg 28.2 29.4  MCHC 31.5 - 35.7 g/dL 29.5 18.8  RDW 41.6 - 60.6 % 13.8 12.3  Platelets 150 - 450 x10E3/uL 156 138 (L)  Neutrophils Not Estab. % 59 54  Immature Granulocytes Not Estab. % 0 0  NEUT# 1.4 - 7.0 x10E3/uL 3.8 3.8   Lymphocyte # 0.7 - 3.1 x10E3/uL 1.8 2.2  Monocytes Absolute 0.1 - 0.9 x10E3/uL 0.5 0.6  Basophils Absolute 0.0 - 0.2 x10E3/uL 0.0 0.1  Immature Grans (Abs) 0.0 - 0.1 x10E3/uL 0.0 0.0  Lymphs Not Estab. % 29 32  Monocytes Not Estab. % 7 8  Basos Not Estab. % 1 1  Eos Not Estab. % 4 5  EOS (ABSOLUTE) 0.0 - 0.4 x10E3/uL 0.2 0.3  Hemoglobin A1C 4.8 - 5.6 % 6.9 (H) 7.2 (H)  Est. average glucose Bld gHb Est-mCnc mg/dL  301  TSH 6.010 - 9.323 uIU/mL 2.550 4.450  Thyroxine (T4) 4.5 - 12.0 ug/dL 55.7 9.1  Free Thyroxine Index 1.2 - 4.9  2.8 WILL FOLLOW (P)  T3 Uptake Ratio 24 - 39 % 28 WILL FOLLOW (P)  (H): Data is abnormally high (L): Data is abnormally low (P): Preliminary

## 2023-01-25 LAB — CMP12+LP+TP+TSH+6AC+CBC/D/PLT
ALT: 22 IU/L (ref 0–32)
AST: 18 IU/L (ref 0–40)
Albumin/Globulin Ratio: 1.8 (ref 1.2–2.2)
Albumin: 4.4 g/dL (ref 3.9–4.9)
Alkaline Phosphatase: 78 IU/L (ref 44–121)
BUN/Creatinine Ratio: 26 (ref 12–28)
BUN: 26 mg/dL (ref 8–27)
Basophils Absolute: 0.1 10*3/uL (ref 0.0–0.2)
Basos: 1 %
Bilirubin Total: 0.5 mg/dL (ref 0.0–1.2)
Calcium: 9.9 mg/dL (ref 8.7–10.3)
Chloride: 101 mmol/L (ref 96–106)
Chol/HDL Ratio: 2.5 ratio (ref 0.0–4.4)
Cholesterol, Total: 177 mg/dL (ref 100–199)
Creatinine, Ser: 0.99 mg/dL (ref 0.57–1.00)
EOS (ABSOLUTE): 0.3 10*3/uL (ref 0.0–0.4)
Eos: 5 %
Estimated CHD Risk: <0.5 times avg. (ref 0.0–1.0)
Free Thyroxine Index: 2.9 (ref 1.2–4.9)
GGT: 12 IU/L (ref 0–60)
Globulin, Total: 2.5 g/dL (ref 1.5–4.5)
Glucose: 123 mg/dL — ABNORMAL HIGH (ref 70–99)
HDL: 72 mg/dL (ref >39)
Hematocrit: 40.6 % (ref 34.0–46.6)
Hemoglobin: 13.2 g/dL (ref 11.1–15.9)
Immature Grans (Abs): 0 10*3/uL (ref 0.0–0.1)
Immature Granulocytes: 0 %
Iron: 104 ug/dL (ref 27–139)
LDH: 215 IU/L (ref 119–226)
LDL Chol Calc (NIH): 85 mg/dL (ref 0–99)
Lymphocytes Absolute: 2.2 10*3/uL (ref 0.7–3.1)
Lymphs: 32 %
MCH: 29.4 pg (ref 26.6–33.0)
MCHC: 32.5 g/dL (ref 31.5–35.7)
MCV: 90 fL (ref 79–97)
Monocytes Absolute: 0.6 10*3/uL (ref 0.1–0.9)
Monocytes: 8 %
Neutrophils Absolute: 3.8 10*3/uL (ref 1.4–7.0)
Neutrophils: 54 %
Phosphorus: 2.9 mg/dL — ABNORMAL LOW (ref 3.0–4.3)
Platelets: 138 10*3/uL — ABNORMAL LOW (ref 150–450)
Potassium: 4.1 mmol/L (ref 3.5–5.2)
RBC: 4.49 x10E6/uL (ref 3.77–5.28)
RDW: 12.3 % (ref 11.7–15.4)
Sodium: 139 mmol/L (ref 134–144)
T3 Uptake Ratio: 32 % (ref 24–39)
T4, Total: 9.1 ug/dL (ref 4.5–12.0)
TSH: 4.45 u[IU]/mL (ref 0.450–4.500)
Total Protein: 6.9 g/dL (ref 6.0–8.5)
Triglycerides: 111 mg/dL (ref 0–149)
Uric Acid: 4.9 mg/dL (ref 3.0–7.2)
VLDL Cholesterol Cal: 20 mg/dL (ref 5–40)
WBC: 7 10*3/uL (ref 3.4–10.8)
eGFR: 63 mL/min/{1.73_m2} (ref >59)

## 2023-01-25 LAB — HEMOGLOBIN A1C
Est. average glucose Bld gHb Est-mCnc: 160 mg/dL
Hgb A1c MFr Bld: 7.2 % — ABNORMAL HIGH (ref 4.8–5.6)

## 2023-01-25 LAB — VITAMIN D 25 HYDROXY (VIT D DEFICIENCY, FRACTURES): Vit D, 25-Hydroxy: 22.7 ng/mL — ABNORMAL LOW (ref 30.0–100.0)

## 2023-02-17 NOTE — Telephone Encounter (Signed)
Labs completed 01/17/23 see epic results note and be well paperwork signed 01/24/23

## 2023-04-20 ENCOUNTER — Other Ambulatory Visit: Payer: Self-pay | Admitting: Registered Nurse

## 2023-04-20 DIAGNOSIS — R7989 Other specified abnormal findings of blood chemistry: Secondary | ICD-10-CM

## 2023-04-20 DIAGNOSIS — E1100 Type 2 diabetes mellitus with hyperosmolarity without nonketotic hyperglycemic-hyperosmolar coma (NKHHC): Secondary | ICD-10-CM

## 2023-04-20 NOTE — Progress Notes (Signed)
Cancel 7/31 lab appt as patient had completed 02/05/23 with PCM Hgba1c 6.9 metformin ER increased to 1500mg  daily weight 232lbs and BP 138/70  LDL 85 met requirements for Be Well insurance discount FY 2025 RN Kimrey notified HR staff and patient 01/23/23 BP 129/70 and weight 231lb with RN Kimrey.  Patient discussed lab results/instructions from NP and given printed copy by RN Bess Kinds  NP signed patient paperwork 01/24/23  Next labs due 3-4 months 8/14-9/14/24 Hgba1c per [facility]  My chart message sent to patient  Monica Walker,  Your PCM checked Hgba1c on 02/05/23 6.9 so next due 8/14-9/14/24.  You can have completed with PCM or RN Kimrey.  Vitamin D lab cancelled due to new national guidelines.  It is recommended take the 50,000 units by mouth weekly with meal until Rx completed then start 2000 units over the counter by mouth daily with meal.  Reminder [facility] dietitian is free link to schedule appt [name] ([email or web address]).  Please let us know if you have further questions.  Sincerely,  Albina Billet NP-C

## 2023-04-23 ENCOUNTER — Telehealth: Payer: Self-pay | Admitting: Registered Nurse

## 2023-04-23 ENCOUNTER — Encounter: Payer: Self-pay | Admitting: Registered Nurse

## 2023-04-23 DIAGNOSIS — E1169 Type 2 diabetes mellitus with other specified complication: Secondary | ICD-10-CM

## 2023-04-23 DIAGNOSIS — E039 Hypothyroidism, unspecified: Secondary | ICD-10-CM

## 2023-04-23 NOTE — Telephone Encounter (Addendum)
Latest Reference Range & Units 01/17/23 08:56  Sodium 134 - 144 mmol/L 139  Potassium 3.5 - 5.2 mmol/L 4.1  Chloride 96 - 106 mmol/L 101  Glucose 70 - 99 mg/dL 932 (H)  BUN 8 - 27 mg/dL 26  Creatinine 3.55 - 7.32 mg/dL 2.02  Calcium 8.7 - 54.2 mg/dL 9.9  BUN/Creatinine Ratio 12 - 28  26  eGFR >59 mL/min/1.73 63  Phosphorus 3.0 - 4.3 mg/dL 2.9 (L)  Alkaline Phosphatase 44 - 121 IU/L 78  Albumin 3.9 - 4.9 g/dL 4.4  Albumin/Globulin Ratio 1.2 - 2.2  1.8  Uric Acid 3.0 - 7.2 mg/dL 4.9  AST 0 - 40 IU/L 18  ALT 0 - 32 IU/L 22  Total Protein 6.0 - 8.5 g/dL 6.9  Total Bilirubin 0.0 - 1.2 mg/dL 0.5  GGT 0 - 60 IU/L 12  Estimated CHD Risk 0.0 - 1.0 times avg.  < 0.5  LDH 119 - 226 IU/L 215  Total CHOL/HDL Ratio 0.0 - 4.4 ratio 2.5  Cholesterol, Total 100 - 199 mg/dL 706  HDL Cholesterol >23 mg/dL 72  Triglycerides 0 - 762 mg/dL 831  VLDL Cholesterol Cal 5 - 40 mg/dL 20  LDL Chol Calc (NIH) 0 - 99 mg/dL 85  Iron 27 - 517 ug/dL 616  Vitamin D, 07-PXTGGYI 30.0 - 100.0 ng/mL 22.7 (L)  Globulin, Total 1.5 - 4.5 g/dL 2.5  WBC 3.4 - 94.8 [id number]/OJ 7.0  RBC 3.77 - 5.28 x10E6/uL 4.49  Hemoglobin 11.1 - 15.9 g/dL 50.0  HCT 93.8 - 18.2 % 40.6  MCV 79 - 97 fL 90  MCH 26.6 - 33.0 pg 29.4  MCHC 31.5 - 35.7 g/dL 99.3  RDW 71.6 - 96.7 % 12.3  Platelets 150 - 450 x10E3/uL 138 (L)  Neutrophils Not Estab. % 54  Immature Granulocytes Not Estab. % 0  NEUT# 1.4 - 7.0 x10E3/uL 3.8  Lymphocyte # 0.7 - 3.1 x10E3/uL 2.2  Monocytes Absolute 0.1 - 0.9 x10E3/uL 0.6  Basophils Absolute 0.0 - 0.2 x10E3/uL 0.1  Immature Grans (Abs) 0.0 - 0.1 x10E3/uL 0.0  Lymphs Not Estab. % 32  Monocytes Not Estab. % 8  Basos Not Estab. % 1  Eos Not Estab. % 5  EOS (ABSOLUTE) 0.0 - 0.4 x10E3/uL 0.3  Hemoglobin A1C 4.8 - 5.6 % 7.2 (H)  Est. average glucose Bld gHb Est-mCnc mg/dL 893  TSH 8.101 - 7.510 uIU/mL 4.450  Thyroxine (T4) 4.5 - 12.0 ug/dL 9.1  Free Thyroxine Index 1.2 - 4.9  2.9  T3 Uptake Ratio 24 - 39 % 32   (H): Data is abnormally high (L): Data is abnormally low Patient last filled 01/24/23 90 tabs each dispensed 90 tabs each atorvastatin 20mg  po daily and levothyroxine po daily from [facility] to patient today.  Labs stable

## 2023-04-24 ENCOUNTER — Other Ambulatory Visit: Payer: Self-pay | Admitting: Occupational Medicine

## 2023-07-16 ENCOUNTER — Telehealth: Payer: Self-pay | Admitting: Registered Nurse

## 2023-07-16 ENCOUNTER — Encounter: Payer: Self-pay | Admitting: Registered Nurse

## 2023-07-16 DIAGNOSIS — E1169 Type 2 diabetes mellitus with other specified complication: Secondary | ICD-10-CM

## 2023-07-16 DIAGNOSIS — E039 Hypothyroidism, unspecified: Secondary | ICD-10-CM

## 2023-07-16 MED ORDER — LEVOTHYROXINE SODIUM 88 MCG PO TABS: 88.0000 ug | ORAL_TABLET | Freq: Every day | ORAL | Status: DC

## 2023-07-16 MED ORDER — ATORVASTATIN CALCIUM 20 MG PO TABS: 20.0000 mg | ORAL_TABLET | Freq: Every day | ORAL | Status: DC

## 2023-07-16 NOTE — Telephone Encounter (Signed)
  Latest Reference Range & Units 01/17/23 08:56   Sodium 134 - 144 mmol/L 139  Potassium 3.5 - 5.2 mmol/L 4.1  Chloride 96 - 106 mmol/L 101  Glucose 70 - 99 mg/dL 846 (H)  BUN 8 - 27 mg/dL 26  Creatinine 9.62 - 9.52 mg/dL 8.41  Calcium 8.7 - 32.4 mg/dL 9.9  BUN/Creatinine Ratio 12 - 28  26  eGFR >59 mL/min/1.73 63  Phosphorus 3.0 - 4.3 mg/dL 2.9 (L)  Alkaline Phosphatase 44 - 121 IU/L 78  Albumin 3.9 - 4.9 g/dL 4.4  Albumin/Globulin Ratio 1.2 - 2.2  1.8  Uric Acid 3.0 - 7.2 mg/dL 4.9  AST 0 - 40 IU/L 18  ALT 0 - 32 IU/L 22  Total Protein 6.0 - 8.5 g/dL 6.9  Total Bilirubin 0.0 - 1.2 mg/dL 0.5  GGT 0 - 60 IU/L 12  Estimated CHD Risk 0.0 - 1.0 times avg.  < 0.5  LDH 119 - 226 IU/L 215  Total CHOL/HDL Ratio 0.0 - 4.4 ratio 2.5  Cholesterol, Total 100 - 199 mg/dL 401  HDL Cholesterol >02 mg/dL 72  Triglycerides 0 - 725 mg/dL 366  VLDL Cholesterol Cal 5 - 40 mg/dL 20  LDL Chol Calc (NIH) 0 - 99 mg/dL 85  Iron 27 - 440 ug/dL 347  Vitamin D, 42-VZDGLOV 30.0 - 100.0 ng/mL 22.7 (L)  Globulin, Total 1.5 - 4.5 g/dL 2.5  WBC 3.4 - 56.4 P32R5/JO 7.0  RBC 3.77 - 5.28 x10E6/uL 4.49  Hemoglobin 11.1 - 15.9 g/dL 84.1  HCT 66.0 - 63.0 % 40.6  MCV 79 - 97 fL 90  MCH 26.6 - 33.0 pg 29.4  MCHC 31.5 - 35.7 g/dL 16.0  RDW 10.9 - 32.3 % 12.3  Platelets 150 - 450 x10E3/uL 138 (L)  Neutrophils Not Estab. % 54  Immature Granulocytes Not Estab. % 0  NEUT# 1.4 - 7.0 x10E3/uL 3.8  Lymphocyte # 0.7 - 3.1 x10E3/uL 2.2  Monocytes Absolute 0.1 - 0.9 x10E3/uL 0.6  Basophils Absolute 0.0 - 0.2 x10E3/uL 0.1  Immature Grans (Abs) 0.0 - 0.1 x10E3/uL 0.0  Lymphs Not Estab. % 32  Monocytes Not Estab. % 8  Basos Not Estab. % 1  Eos Not Estab. % 5  EOS (ABSOLUTE) 0.0 - 0.4 x10E3/uL 0.3  Hemoglobin A1C 4.8 - 5.6 % 7.2 (H)  Est. average glucose Bld gHb Est-mCnc mg/dL 557  TSH 3.220 - 2.542 uIU/mL 4.450  Thyroxine (T4) 4.5 - 12.0 ug/dL 9.1  Free Thyroxine Index 1.2 - 4.9  2.9  T3 Uptake Ratio 24 - 39 % 32   (H): Data is abnormally high (L): Data is abnormally low Saw PCM 06/14/23  A1c recheck 6.8 Patient last filled7/30/24 90 tabs each dispensed 90 tabs each atorvastatin 20mg  po daily and levothyroxine po daily from [facility] to patient today.  Labs stable next due Apr 2025 fasting Be Well 2026

## 2023-07-22 ENCOUNTER — Ambulatory Visit: Payer: No Typology Code available for payment source

## 2023-07-22 DIAGNOSIS — Z23 Encounter for immunization: Secondary | ICD-10-CM

## 2023-07-24 ENCOUNTER — Other Ambulatory Visit: Payer: Self-pay

## 2023-07-25 ENCOUNTER — Other Ambulatory Visit: Payer: Self-pay

## 2023-10-17 ENCOUNTER — Telehealth: Payer: Self-pay | Admitting: Registered Nurse

## 2023-10-17 ENCOUNTER — Encounter: Payer: Self-pay | Admitting: Registered Nurse

## 2023-10-17 DIAGNOSIS — E039 Hypothyroidism, unspecified: Secondary | ICD-10-CM

## 2023-10-17 DIAGNOSIS — E1169 Type 2 diabetes mellitus with other specified complication: Secondary | ICD-10-CM

## 2023-10-17 NOTE — Telephone Encounter (Signed)
Latest Reference Range & Units 01/17/23 08:56    Sodium 134 - 144 mmol/L 139  Potassium 3.5 - 5.2 mmol/L 4.1  Chloride 96 - 106 mmol/L 101  Glucose 70 - 99 mg/dL 098 (H)  BUN 8 - 27 mg/dL 26  Creatinine 1.19 - 1.47 mg/dL 8.29  Calcium 8.7 - 56.2 mg/dL 9.9  BUN/Creatinine Ratio 12 - 28  26  eGFR >59 mL/min/1.73 63  Phosphorus 3.0 - 4.3 mg/dL 2.9 (L)  Alkaline Phosphatase 44 - 121 IU/L 78  Albumin 3.9 - 4.9 g/dL 4.4  Albumin/Globulin Ratio 1.2 - 2.2  1.8  Uric Acid 3.0 - 7.2 mg/dL 4.9  AST 0 - 40 IU/L 18  ALT 0 - 32 IU/L 22  Total Protein 6.0 - 8.5 g/dL 6.9  Total Bilirubin 0.0 - 1.2 mg/dL 0.5  GGT 0 - 60 IU/L 12  Estimated CHD Risk 0.0 - 1.0 times avg.  < 0.5  LDH 119 - 226 IU/L 215  Total CHOL/HDL Ratio 0.0 - 4.4 ratio 2.5  Cholesterol, Total 100 - 199 mg/dL 130  HDL Cholesterol >86 mg/dL 72  Triglycerides 0 - 578 mg/dL 469  VLDL Cholesterol Cal 5 - 40 mg/dL 20  LDL Chol Calc (NIH) 0 - 99 mg/dL 85  Iron 27 - 629 ug/dL 528  Vitamin D, 41-LKGMWNU 30.0 - 100.0 ng/mL 22.7 (L)  Globulin, Total 1.5 - 4.5 g/dL 2.5  WBC 3.4 - 27.2 Z36U4/QI 7.0  RBC 3.77 - 5.28 x10E6/uL 4.49  Hemoglobin 11.1 - 15.9 g/dL 34.7  HCT 42.5 - 95.6 % 40.6  MCV 79 - 97 fL 90  MCH 26.6 - 33.0 pg 29.4  MCHC 31.5 - 35.7 g/dL 38.7  RDW 56.4 - 33.2 % 12.3  Platelets 150 - 450 x10E3/uL 138 (L)  Neutrophils Not Estab. % 54  Immature Granulocytes Not Estab. % 0  NEUT# 1.4 - 7.0 x10E3/uL 3.8  Lymphocyte # 0.7 - 3.1 x10E3/uL 2.2  Monocytes Absolute 0.1 - 0.9 x10E3/uL 0.6  Basophils Absolute 0.0 - 0.2 x10E3/uL 0.1  Immature Grans (Abs) 0.0 - 0.1 x10E3/uL 0.0  Lymphs Not Estab. % 32  Monocytes Not Estab. % 8  Basos Not Estab. % 1  Eos Not Estab. % 5  EOS (ABSOLUTE) 0.0 - 0.4 x10E3/uL 0.3  Hemoglobin A1C 4.8 - 5.6 % 7.2 (H)  Est. average glucose Bld gHb Est-mCnc mg/dL 951  TSH 8.841 - 6.606 uIU/mL 4.450  Thyroxine (T4) 4.5 - 12.0 ug/dL 9.1  Free Thyroxine Index 1.2 - 4.9  2.9  T3 Uptake Ratio 24 - 39 % 32   (H): Data is abnormally high (L): Data is abnormally low Saw PCM 06/14/23  A1c recheck 6.8 Patient last filled 07/16/23 90 tabs each dispensed 90 tabs each atorvastatin 20mg  po daily and levothyroxine po daily from [facility] to patient today.  Labs stable next due Apr 2025 fasting Be Well 2026

## 2023-12-03 ENCOUNTER — Telehealth: Payer: Self-pay | Admitting: Registered Nurse

## 2023-12-03 ENCOUNTER — Encounter: Payer: Self-pay | Admitting: Registered Nurse

## 2023-12-03 DIAGNOSIS — R7989 Other specified abnormal findings of blood chemistry: Secondary | ICD-10-CM

## 2023-12-03 DIAGNOSIS — Z0000 Encounter for general adult medical examination without abnormal findings: Secondary | ICD-10-CM

## 2023-12-03 NOTE — Telephone Encounter (Signed)
 Be Well 2026 appt 12/05/2023 scheduled; orders entered for patient executive panel.  Had hgba1c with PCM   Component Ref Range & Units 3 wk ago  Hemoglobin A1c 4.8 - 5.6 % 6.8 Abnormal   Resulting Agency [facility]  Specimen Collected: 11/06/23 09:13   Performed by: [facility] Last Resulted: 11/06/23 09:13  Received From: [facility]  Result Received: 12/03/23 15:41   View Encounter    Will need to see RN Olegario Messier next week for results, Ht/Wt/BP check, sign tobacco attestation and Be Well forms.

## 2023-12-05 ENCOUNTER — Other Ambulatory Visit: Payer: Self-pay

## 2023-12-05 DIAGNOSIS — Z0000 Encounter for general adult medical examination without abnormal findings: Secondary | ICD-10-CM

## 2023-12-06 LAB — CMP12+LP+TP+TSH+6AC+CBC/D/PLT
ALT: 16 IU/L (ref 0–32)
AST: 14 IU/L (ref 0–40)
Albumin: 4.4 g/dL (ref 3.9–4.9)
Alkaline Phosphatase: 79 IU/L (ref 44–121)
BUN/Creatinine Ratio: 30 — ABNORMAL HIGH (ref 12–28)
BUN: 31 mg/dL — ABNORMAL HIGH (ref 8–27)
Bilirubin Total: 0.5 mg/dL (ref 0.0–1.2)
Calcium: 9.9 mg/dL (ref 8.7–10.3)
Chloride: 104 mmol/L (ref 96–106)
Chol/HDL Ratio: 2.7 ratio (ref 0.0–4.4)
Cholesterol, Total: 180 mg/dL (ref 100–199)
Creatinine, Ser: 1.02 mg/dL — ABNORMAL HIGH (ref 0.57–1.00)
Estimated CHD Risk: <0.5 times avg. (ref 0.0–1.0)
Free Thyroxine Index: 3 (ref 1.2–4.9)
GGT: 13 IU/L (ref 0–60)
Globulin, Total: 2.5 g/dL (ref 1.5–4.5)
Glucose: 123 mg/dL — ABNORMAL HIGH (ref 70–99)
HDL: 67 mg/dL (ref >39)
Iron: 95 ug/dL (ref 27–139)
LDH: 205 IU/L (ref 119–226)
LDL Chol Calc (NIH): 90 mg/dL (ref 0–99)
Phosphorus: 2.9 mg/dL — ABNORMAL LOW (ref 3.0–4.3)
Potassium: 4.3 mmol/L (ref 3.5–5.2)
Sodium: 142 mmol/L (ref 134–144)
T3 Uptake Ratio: 30 % (ref 24–39)
T4, Total: 10.1 ug/dL (ref 4.5–12.0)
TSH: 2.86 u[IU]/mL (ref 0.450–4.500)
Total Protein: 6.9 g/dL (ref 6.0–8.5)
Triglycerides: 136 mg/dL (ref 0–149)
Uric Acid: 5.9 mg/dL (ref 3.0–7.2)
VLDL Cholesterol Cal: 23 mg/dL (ref 5–40)
eGFR: 61 mL/min/{1.73_m2} (ref >59)

## 2023-12-19 ENCOUNTER — Ambulatory Visit: Payer: Self-pay

## 2023-12-19 ENCOUNTER — Encounter: Payer: Self-pay | Admitting: Registered Nurse

## 2023-12-19 ENCOUNTER — Telehealth: Payer: Self-pay | Admitting: Registered Nurse

## 2023-12-19 DIAGNOSIS — E1169 Type 2 diabetes mellitus with other specified complication: Secondary | ICD-10-CM

## 2023-12-19 DIAGNOSIS — E039 Hypothyroidism, unspecified: Secondary | ICD-10-CM

## 2023-12-19 MED ORDER — LEVOTHYROXINE SODIUM 88 MCG PO TABS: 88.0000 ug | ORAL_TABLET | Freq: Every day | ORAL | 3 refills | Status: DC

## 2023-12-19 MED ORDER — ATORVASTATIN CALCIUM 20 MG PO TABS: 20.0000 mg | ORAL_TABLET | Freq: Every day | ORAL | 3 refills | Status: DC

## 2023-12-19 NOTE — Telephone Encounter (Signed)
 Patient had BP recheck with RN Olegario Messier today as elevated at Be Well check 12/05/23.  Patient had seen PCM recently and medications adjusted.  BP today 129/74 LDL 90 weight 224lbs BMI 36.2  Completed [facility] form and given to HR and patient given printout and discussed lab results in detail.  Using nsaid every other day for leg/feet pain. Discussed if shoes greater than 6 months old recommend new pain, avoiding dehydration drinking water. Patient signed Be Well ROI and tobacco attestation nonsmoker 12/05/23.  NP signed Be Well forms today also. Patient verbalized understanding information/instructions, agreed with plan of care and had no further questions at this time.

## 2023-12-19 NOTE — Telephone Encounter (Signed)
 Latest Reference Range & Units 12/05/23 07:15  Sodium 134 - 144 mmol/L 142  Potassium 3.5 - 5.2 mmol/L 4.3  Chloride 96 - 106 mmol/L 104  Glucose 70 - 99 mg/dL 086 (H)  BUN 8 - 27 mg/dL 31 (H)  Creatinine 5.78 - 1.00 mg/dL 4.69 (H)  Calcium 8.7 - 10.3 mg/dL 9.9  BUN/Creatinine Ratio 12 - 28  30 (H)  eGFR >59 mL/min/1.73 61  Phosphorus 3.0 - 4.3 mg/dL 2.9 (L)  Alkaline Phosphatase 44 - 121 IU/L 79  Albumin 3.9 - 4.9 g/dL 4.4  Uric Acid 3.0 - 7.2 mg/dL 5.9  AST 0 - 40 IU/L 14  ALT 0 - 32 IU/L 16  Total Protein 6.0 - 8.5 g/dL 6.9  Total Bilirubin 0.0 - 1.2 mg/dL 0.5  GGT 0 - 60 IU/L 13  Estimated CHD Risk 0.0 - 1.0 times avg.  < 0.5  LDH 119 - 226 IU/L 205  Total CHOL/HDL Ratio 0.0 - 4.4 ratio 2.7  Cholesterol, Total 100 - 199 mg/dL 629  HDL Cholesterol >52 mg/dL 67  Triglycerides 0 - 841 mg/dL 324  VLDL Cholesterol Cal 5 - 40 mg/dL 23  LDL Chol Calc (NIH) 0 - 99 mg/dL 90  Iron 27 - 401 ug/dL 95  Globulin, Total 1.5 - 4.5 g/dL 2.5  WBC U27O5/DG CANCELED  TSH 0.450 - 4.500 uIU/mL 2.860  Thyroxine (T4) 4.5 - 12.0 ug/dL 64.4  Free Thyroxine Index 1.2 - 4.9  3.0  T3 Uptake Ratio 24 - 39 % 30  (H): Data is abnormally high (L): Data is abnormally low  Saw PCM 11/06/2023 A1c recheck 6.8 Patient last filled 10/17/2023 90 tabs each dispensed 90 tabs each atorvastatin 20mg  po daily and levothyroxine po daily from [facility] to patient today. Labs stable next due Apr 2026 fasting Be Well 2027  Patient given printout of Be Well 2026 results with instructions today.  Discussed drink water avoid daily NSAID if possible and avoid dehydration.  Patient verbalized understanding information/instructions, agreed with plan of care and had no further questions at this time.

## 2023-12-19 NOTE — Progress Notes (Signed)
 BP check f/u

## 2024-01-14 ENCOUNTER — Encounter: Payer: Self-pay | Admitting: Registered Nurse

## 2024-01-14 ENCOUNTER — Ambulatory Visit: Payer: Self-pay | Admitting: Registered Nurse

## 2024-01-14 VITALS — BP 129/80

## 2024-01-14 DIAGNOSIS — G8929 Other chronic pain: Secondary | ICD-10-CM

## 2024-01-14 DIAGNOSIS — Z8639 Personal history of other endocrine, nutritional and metabolic disease: Secondary | ICD-10-CM

## 2024-01-14 DIAGNOSIS — M7652 Patellar tendinitis, left knee: Secondary | ICD-10-CM

## 2024-01-14 MED ORDER — GLUCOSAMINE-CHONDROITIN 750 & 400 MG PO MISC: 1.0000 | Freq: Two times a day (BID) | ORAL | Status: AC

## 2024-01-14 MED ORDER — ACETAMINOPHEN 500 MG PO TABS: 1000.0000 mg | ORAL_TABLET | Freq: Four times a day (QID) | ORAL | Status: AC | PRN

## 2024-01-14 MED ORDER — VITAMIN D3 50 MCG (2000 UT) PO CAPS: 2000.0000 [IU] | ORAL_CAPSULE | Freq: Every day | ORAL | 3 refills | Status: AC

## 2024-01-14 NOTE — Progress Notes (Signed)
 Established Patient Office Visit  Subjective   Patient ID: Monica Walker, female    DOB: 1955-10-01  Age: 68 y.o. MRN: [id number]  Chief Complaint  Patient presents with   Pain    Left knee for 10 days tried compression sleeve helping a little but pain worst after standing all day at work    67y/o established female patient here for evaluation left knee pain worsens over course of the day standing at work.  Denied trauma recent--fell on it as girl.  Denied swelling/bruising/rash/hot to touch.  Sometimes pain radiates into calf.  Has had varicose vein treatments to left lower leg.  Does not wear compression socks/knee high as worsens pain at work.  Takes naproxen po prn but not every day      Review of Systems  Constitutional:  Negative for chills, diaphoresis and fever.  Respiratory:  Negative for cough.   Cardiovascular:  Negative for leg swelling.  Musculoskeletal:  Positive for joint pain and myalgias. Negative for back pain and falls.  Skin:  Negative for itching and rash.  Neurological:  Negative for tingling, tremors, sensory change and weakness.  Psychiatric/Behavioral:  The patient does not have insomnia.       Objective:     BP 129/80 (BP Location: Left Arm, Patient Position: Sitting, Cuff Size: Normal)    Physical Exam Vitals and nursing note reviewed.  Constitutional:      General: She is awake. She is not in acute distress.    Appearance: Normal appearance. She is well-developed, well-groomed and overweight. She is not ill-appearing, toxic-appearing or diaphoretic.  HENT:     Head: Normocephalic and atraumatic.     Jaw: There is normal jaw occlusion.     Salivary Glands: Right salivary gland is not diffusely enlarged. Left salivary gland is not diffusely enlarged.     Right Ear: Hearing and external ear normal.     Left Ear: Hearing and external ear normal.     Nose: Nose normal. No congestion or rhinorrhea.     Mouth/Throat:     Lips: Pink. No lesions.      Mouth: Mucous membranes are moist. No oral lesions or angioedema.     Dentition: No gum lesions.     Pharynx: Oropharynx is clear. Uvula midline.  Eyes:     General: Lids are normal. Vision grossly intact. Gaze aligned appropriately. Allergic shiner present. No scleral icterus.       Right eye: No discharge.        Left eye: No discharge.     Extraocular Movements: Extraocular movements intact.     Conjunctiva/sclera: Conjunctivae normal.     Pupils: Pupils are equal, round, and reactive to light.  Neck:     Trachea: Trachea and phonation normal.  Cardiovascular:     Rate and Rhythm: Normal rate and regular rhythm.     Pulses:          Radial pulses are 2+ on the right side and 2+ on the left side.  Pulmonary:     Effort: Pulmonary effort is normal.     Breath sounds: Normal breath sounds and air entry. No stridor or transmitted upper airway sounds. No wheezing.     Comments: Spoke full sentences without difficulty; no cough observed in exam room Abdominal:     General: Abdomen is flat.  Musculoskeletal:        General: Normal range of motion.     Right hand: Normal strength. Normal capillary refill.  Left hand: Normal strength. Normal capillary refill.     Cervical back: Normal range of motion and neck supple. No edema, erythema, signs of trauma, rigidity, torticollis or crepitus.     Right upper leg: No swelling, edema, deformity, lacerations or tenderness.     Left upper leg: No swelling, edema, deformity, lacerations or tenderness.     Right knee: No swelling, deformity, effusion, erythema, ecchymosis, lacerations, bony tenderness or crepitus. Normal range of motion. Tenderness present over the medial joint line and lateral joint line. No patellar tendon tenderness. No LCL laxity, MCL laxity, ACL laxity or PCL laxity. Normal alignment and normal patellar mobility.     Left knee: Bony tenderness and crepitus present. No swelling, deformity, effusion, erythema, ecchymosis or  lacerations. Normal range of motion. Tenderness present over the medial joint line and lateral joint line. No patellar tendon tenderness. No LCL laxity, MCL laxity, ACL laxity or PCL laxity.Normal alignment and normal patellar mobility.     Right lower leg: No swelling, deformity, lacerations, tenderness or bony tenderness. No edema.     Left lower leg: Tenderness present. No swelling, deformity, lacerations or bony tenderness. No edema.     Right ankle: No swelling, deformity, ecchymosis or lacerations. No tenderness. Normal range of motion.     Left ankle: No swelling, deformity, ecchymosis or lacerations. No tenderness. Normal range of motion.       Legs:     Comments: Left knee audible and palpable crepitus with repetitive flexion extension AROM lying supine exam table; medial and lateral joint lines left greater than right TTP with deep palpation; negative bilateral patellar apprehension; AROM equal bilateral knees; negative anterior/posterior drawer tests/lachmann's/valgus/varus stress tests bilaterally; no defects palpable gastrocnemius bilaterally; no achilles tendon bilateral TTP  Lymphadenopathy:     Cervical:     Right cervical: No superficial cervical adenopathy.    Left cervical: No superficial cervical adenopathy.  Skin:    General: Skin is warm and dry.     Capillary Refill: Capillary refill takes less than 2 seconds.     Coloration: Skin is not ashen, cyanotic, jaundiced, mottled, pale or sallow.     Findings: No abrasion, abscess, acne, bruising, burn, ecchymosis, erythema, signs of injury, laceration, lesion, petechiae, rash or wound.     Nails: There is no clubbing.     Comments: Face/hands/neck/left knee/lower leg  Neurological:     General: No focal deficit present.     Mental Status: She is alert and oriented to person, place, and time. Mental status is at baseline.     GCS: GCS eye subscore is 4. GCS verbal subscore is 5. GCS motor subscore is 6.     Cranial Nerves:  Cranial nerves 2-12 are intact. No cranial nerve deficit, dysarthria or facial asymmetry.     Sensory: Sensation is intact.     Motor: Motor function is intact. No weakness, tremor, atrophy, abnormal muscle tone or seizure activity.     Coordination: Coordination is intact. Coordination normal.     Gait: Gait is intact. Gait normal.     Comments: In/out of chair and on/off exam table without difficulty; gait sure and steady in clinic; bilateral hand grasp equal 5/5  Psychiatric:        Attention and Perception: Attention and perception normal.        Mood and Affect: Mood and affect normal.        Speech: Speech normal.        Behavior: Behavior normal. Behavior is  cooperative.        Thought Content: Thought content normal.        Cognition and Memory: Cognition and memory normal.        Judgment: Judgment normal.      No results found for any visits on 01/14/24.    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 The Endocrine Society went on to further define vitamin D  insufficiency as a level between 21 and 29 ng/mL (2). 1. IOM (Institute of Medicine). 2010. Dietary reference    intakes for calcium  and D. [place]: The    [employer]. 2. Holick MF, Binkley [place], Bischoff-Ferrari HA, et al.    Evaluation, treatment, and prevention of vitamin D     deficiency: an Endocrine  Society clinical practice    guideline. JCEM. 2011 Jul; 96(7):1911-30.   Assessment & Plan:  A left knee pain, patellar tendonitis left and history of vitamin D  deficiency Problem List Items Addressed This Visit   None Visit Diagnoses       Chronic pain of left knee    -  Primary   Relevant Medications   acetaminophen  (TYLENOL ) 500 MG tablet     Discussed with patient and daughter in law Marquez with patient at appt.  Start glucosamine chondroitin 750/400mg  po BID x 3 months to see if any improvement in symptoms.  History of low vitamin D  recommended 2000 units po daily with meal. Fitted and distributed large neoprene knee sleeve from clinic stock to patient.  She reported felt much better than her elastic compression sleeve on application.  Patient was instructed to rest, heat if stiff 15 minutes or ice 15 minutes QID prn swelling/pain and elevate leg after work on breaks.  Wear neoprene sleeve at work/when awake remove to sleep.  Consider pillow between knees for sleeping  tylenol  1000mg  po q6h prn pain OTC given 4 UD from clinic stock.  She has naproxen at home max dose 500mg  po q12h OTC.  If she wants to trial diclofenac  gel 1% OTC 4g applied every 6 hours stop naproxen.  Discussed advil/aleve/ibuprofen/diclofenac  all in NSAID family only use one per day. Medications as directed. Patient may take NSAIDS as needed. Call or return to clinic as needed if these symptoms worsen or fail to improve as anticipated. Discussed mild patellar tendonitis left also  exitcare handout home exercises given.  Consider knee xray patient stated going to her home country in [place] soon Craig much cheaper there and plans to complete at that time. Patient verbalized agreement and understanding of treatment plan and had no further questions at this time.  Exitcare handout on knee sprain and rehab exercises given to patient. P2: ROM exercises, Stretching, and Hand out given     Return in about 2 weeks (around 01/28/2024),  or if symptoms worsen or fail  to improve.    Richardine Chancy, NP

## 2024-01-14 NOTE — Patient Instructions (Addendum)
 Acute Knee Pain, Adult Many things can cause knee pain. Sometimes, knee pain is sudden (acute). It may be caused by damage, swelling, or irritation of the muscles and tissues that support your knee. Pain may come from: A fall. An injury to the knee from twisting motions. A hit to the knee. Infection. The pain often goes away on its own with time and rest. If the pain does not go away, tests may be done to find out what is causing the pain. These may include: Imaging tests, such as an X-ray, MRI, CT scan, or ultrasound. Joint aspiration. In this test, fluid is removed from the knee and checked. Arthroscopy. In this test, a lighted tube is put in the knee and an image is shown on a screen. A biopsy. In this test, a health care provider will remove a small piece of tissue for testing. Follow these instructions at home: If you have a knee sleeve or brace that can be taken off:  Wear the knee sleeve or brace as told by your provider. Take it off only if your provider says that you can. Check the skin around it every day. Tell your provider if you see problems. Loosen the knee sleeve or brace if your toes tingle, are numb, or turn cold and blue. Keep the knee sleeve or brace clean and dry. Bathing If the knee sleeve or brace is not waterproof: Do not let it get wet. Cover it when you take a bath or shower. Use a cover that does not let any water in. Managing pain, stiffness, and swelling  If told, put ice on the area. If you have a knee sleeve or brace that you can take off, remove it as told. Put ice in a plastic bag. Place a towel between your skin and the bag. Leave the ice on for 20 minutes, 2-3 times a day. If your skin turns bright red, take off the ice right away to prevent skin damage. The risk of damage is higher if you cannot feel pain, heat, or cold. Move your toes often to reduce stiffness and swelling. Raise the injured area above the level of your heart while you are sitting  or lying down. Use a pillow to support your foot as needed. If told, use an elastic bandage to put pressure (compression) on your injured knee. This may control swelling, give support, and help with discomfort. Sleep with a pillow under your knee. Activity Rest your knee. Do not do things that cause pain or make pain worse. Do not stand or walk on your injured knee until you're told it's okay. Use crutches as told. Avoid activities where both feet leave the ground at the same time and put stress on the joints. Avoid running, jumping rope, and doing jumping jacks. Work with a physical therapist to make a safe exercise program if told. Physical therapy helps your knee move better and get stronger. Exercise as told. General instructions Take your medicines only as told by your provider. If you are overweight, work with your provider and an expert in healthy eating, called a dietician, to set goals to lose weight. Being overweight can make your knee hurt more. Do not smoke, vape, or use products with nicotine or tobacco in them. If you need help quitting, talk with your provider. Return to normal activities when you are told. Ask what things are safe for you to do. Watch for any changes in your symptoms. Keep all follow-up visits. Your provider will check  your healing and adjust treatments if needed. Contact a health care provider if: The knee pain does not stop. The knee pain changes or gets worse. You have a fever along with knee pain. Your knee is red or feels warm when you touch it. Your knee gives out or locks up. Get help right away if: Your knee swells and the swelling gets worse. You cannot move your knee. You have very bad knee pain that does not get better with medicine. This information is not intended to replace advice given to you by your health care provider. Make sure you discuss any questions you have with your health care provider. Document Revised: 06/13/2023 Document  Reviewed: 11/05/2022 Elsevier Patient Education  2024 [employer]Osteoarthritis  Osteoarthritis is a type of arthritis. It refers to joint pain or joint disease. Osteoarthritis affects tissue that covers the ends of bones in joints (cartilage). Cartilage acts as a cushion between the bones and helps them move smoothly. Osteoarthritis occurs when cartilage in the joints gets worn down. Osteoarthritis is sometimes called "wear and tear" arthritis. Osteoarthritis is the most common form of arthritis. It often occurs in older people. It is a condition that gets worse over time. The joints most often affected by this condition are in the fingers, toes, hips, knees, and spine, including the neck and lower back. What are the causes? This condition is caused by the wearing down of cartilage that covers the ends of bones. What increases the risk? The following factors may make you more likely to develop this condition: Being age 34 or older. Obesity. Overuse of joints. Past injury of a joint. Past surgery on a joint. Family history of osteoarthritis. What are the signs or symptoms? The main symptoms of this condition are pain, swelling, and stiffness in the joint. Other symptoms may include: An enlarged joint. More pain and further damage caused by small pieces of bone or cartilage that break off and float inside of the joint. Small deposits of bone (osteophytes) that grow on the edges of the joint. A grating or scraping feeling inside the joint when you move it. Popping or creaking sounds when you move. Difficulty walking or exercising. An inability to grip items, twist your hand, or control the movements of your hands and fingers. How is this diagnosed? This condition may be diagnosed based on: Your medical history. A physical exam. Your symptoms. X-rays of the affected joints. Blood tests to rule out other types of arthritis. How is this treated? There is no cure for this condition, but  treatment can help control pain and improve joint function. Treatment may include a combination of therapies, such as: Pain relief techniques, such as: Applying heat and cold to the joint. Massage. A form of talk therapy called cognitive behavioral therapy (CBT). This therapy helps you set goals and follow up on the changes that you make. Medicines for pain and inflammation. The medicines can be taken by mouth or applied to the skin. They include: NSAIDs, such as ibuprofen. Prescription medicines. Strong anti-inflammatory medicines (corticosteroids). Certain nutritional supplements. A prescribed exercise program. You may work with a physical therapist. Assistive devices, such as a brace, wrap, splint, specialized glove, or cane. A weight control plan. Surgery, such as: An osteotomy. This is done to reposition the bones and relieve pain or to remove loose pieces of bone and cartilage. Joint replacement surgery. You may need this surgery if you have advanced osteoarthritis. Follow these instructions at home: Activity Rest your affected joints as  told by your health care provider. Exercise as told by your provider. The provider may recommend specific types of exercise, such as: Strengthening exercises. These are done to strengthen the muscles that support joints affected by arthritis. Aerobic activities. These are exercises, such as brisk walking or water aerobics, that increase your heart rate. Range-of-motion activities. These help your joints move more easily. Balance and agility exercises. Managing pain, stiffness, and swelling     If told, apply heat to the affected area as often as told by your provider. Use the heat source that your provider recommends, such as a moist heat pack or a heating pad. If you have a removable assistive device, remove it as told by your provider. Place a towel between your skin and the heat source. If your provider tells you to keep the assistive device on  while you apply heat, place a towel between the assistive device and the heat source. Leave the heat on for 20-30 minutes. If told, put ice on the affected area. If you have a removable assistive device, remove it as told by your provider. Put ice in a plastic bag. Place a towel between your skin and the bag. If your provider tells you to keep the assistive device on during icing, place a towel between the assistive device and the bag. Leave the ice on for 20 minutes, 2-3 times a day. If your skin turns bright red, remove the ice or heat right away to prevent skin damage. The risk of damage is higher if you cannot feel pain, heat, or cold. Move your fingers or toes often to reduce stiffness and swelling. Raise (elevate) the affected area above the level of your heart while you are sitting or lying down. General instructions Take over-the-counter and prescription medicines only as told by your provider. Maintain a healthy weight. Follow instructions from your provider for weight control. Do not use any products that contain nicotine or tobacco. These products include cigarettes, chewing tobacco, and vaping devices, such as e-cigarettes. If you need help quitting, ask your provider. Use assistive devices as told by your provider. Where to find more information [employer] of Arthritis and Musculoskeletal and Skin Diseases: niams.http://www.myers.net/ [employer] on Aging: BaseRingTones.pl American College of Rheumatology: rheumatology.org Contact a health care provider if: You have redness, swelling, or a feeling of warmth in a joint that gets worse. You have a fever along with joint or muscle aches. You develop a rash. You have trouble doing your normal activities. You have pain that gets worse and is not relieved by pain medicine. This information is not intended to replace advice given to you by your health care provider. Make sure you discuss any questions you have with your health care  provider. Document Revised: 05/10/2022 Document Reviewed: 05/10/2022 Elsevier Patient Education  2024 [employer]  Patellar Tendinitis Rehab Ask your health care provider which exercises are safe for you. Do exercises exactly as told by your health care provider and adjust them as directed. It is normal to feel mild stretching, pulling, tightness, or discomfort as you do these exercises. Stop right away if you feel sudden pain or your pain gets worse. Do not begin these exercises until told by your health care provider. Stretching and range-of-motion exercise This exercise warms up your muscles and joints and improves the movement and flexibility of your knee. The exercise also helps to relieve pain and stiffness. Hamstring, doorway stretch This is an exercise in which you lie in a doorway and  prop your leg on a wall to stretch the back of your knee and thigh (hamstring). Lie on your back in front of a doorway with your left / right leg resting against the wall and your other leg flat on the floor in the doorway. There should be a slight bend in your left / right knee. Straighten your left / right knee. You should feel a stretch behind your knee or thigh. If you do not, scoot your buttocks closer to the door. Hold this position for ____30______ seconds. Repeat ____3______ times. Complete this exercise _____2_____ times a day. Strengthening exercises These exercises build strength and endurance in your knee. Endurance is the ability to use your muscles for a long time, even after they get tired. Quadriceps, isometric This exercise stretches the muscles in front of your thigh (quadriceps) without moving your knee joint (isometric). Lie on your back with your left / right leg extended and your other knee bent. Slowly tense the muscles in the front of your left / right thigh. When you do this, you should see your kneecap slide up toward your hip or see increased dimpling just above the knee. This  motion will push the back of your knee toward the floor. If this is painful, try putting a rolled-up hand towel under your knee to support it in a bent position. Change the size of the towel to find a position that allows you to do this exercise without any pain. For _____30_____ seconds, hold the muscle as tight as you can without increasing your pain. Relax the muscles slowly and completely. Repeat ___3_______ times. Complete this exercise ______2____ times a day. Straight leg raises, flexors This exercise stretches the muscles in front of your thigh (quadriceps) and the muscles that move your hips (hip flexors). Lie on your back with your left / right leg extended and your other knee bent. Tense the muscles in the front of your left / right thigh. When you do this, you should see your kneecap slide up or see increased dimpling just above the knee. Keep these muscles tight as you raise your leg 4-6 inches (10-15 cm) off the floor. Do not let your moving knee bend. Hold this position for ____30______ seconds. Keep these muscles tense as you slowly lower your leg. Relax your muscles slowly and completely. Repeat ____3______ times. Complete this exercise _____2_____ times a day. Squats This is a weight-bearing exercise in which you bend your knees and lower your hips while engaging your thigh muscles. Stand in front of a table, with your feet and knees pointing straight ahead. You may rest your hands on the table for balance but not for support. Slowly bend your knees and lower your hips like you are going to sit in a chair. Keep your weight over your heels, not over your toes. Keep your lower legs upright so they are parallel with the table legs. Do not let your hips go lower than your knees. Do not bend lower than told by your health care provider. If your knee pain increases, do not bend as low. Hold the squat position for ___30_______ seconds. Slowly push with your legs to return to  standing. Do not use your hands to pull yourself to standing. Repeat __3________ times. Complete this exercise ____2______ times a day. Step-downs This is an exercise in which you step down slowly while engaging your leg muscles. Stand on the edge of a step. Keeping your weight over your left / right heel, slowly bend  your left / right knee to bring your left / right heel toward the floor. Lower your heel as far as you can while keeping control and without increasing any discomfort. Do not let your left / right knee come forward. Use your leg muscles, not gravity, to lower your body. Hold on to a wall or rail for balance if needed. Slowly push through your heel to lift your body weight back up. Return to the starting position. Repeat ____3______ times. Complete this exercise ____2______ times a day. Straight leg raises, abductors This exercise strengthens the muscles that rotate the leg at the hip and move it away from your body (hip abductors). Lie on your side with your left / right leg in the top position. Lie so your head, shoulder, knee, and hip line up. You may bend your bottom knee to help you keep your balance. Roll your hips slightly forward, so that your hips are stacked directly over each other and your left / right knee is facing forward. Leading with your heel, lift your top leg 4-6 inches (10-15 cm). You should feel the muscles in your outer hip lifting. Do not let your foot drift forward. Do not let your knee roll toward the ceiling. Hold this position for _____30_____ seconds. Slowly lower your leg to the starting position. Let your muscles relax completely after each repetition. Repeat ___3_______ times. Complete this exercise ____2______ times a day. This information is not intended to replace advice given to you by your health care provider. Make sure you discuss any questions you have with your health care provider. Document Revised: 04/18/2021 Document Reviewed:  04/18/2021 Elsevier Patient Education  2024 [employer].Patellar Tendinitis  Patellar tendinitis is also called jumper's knee or patellar tendinopathy. This condition happens when there is damage to the patellar tendon. Tendons are cord-like tissues that connect muscles to bones. The patellar tendon connects the bottom of the kneecap (patella) to the top of the shin bone (tibia). Patellar tendinitis causes pain in the front of the knee. The condition is classified into the following stages: Stage 1: You have pain only after activity. Stage 2: You have pain during and after activity. Stage 3: You have pain at rest as well as during and after activity. The pain limits your ability to do the activity. Stage 4: You have tendon tears. The tears severely limit your activity. What are the causes? This condition is caused by repeated (repetitive) stress on the tendon. This stress may cause the tendon to stretch, swell, thicken, or tear. What increases the risk? The following factors may make you more likely to develop this condition: Participating in sports that involve running, kicking, and jumping, especially on hard surfaces. These include: Basketball. Volleyball. Soccer. Track and field. Training too hard. Having tight thigh muscles. Having received steroid injections in the tendon. Having had knee surgery. Being 80-33 years old. Having rheumatoid arthritis, diabetes, or kidney disease. These conditions interrupt blood flow to the knee, causing the tendon to weaken. What are the signs or symptoms? The main symptom of this condition is pain and swelling in the front of the knee. The pain usually starts slowly and gradually gets worse. It may become painful to straighten your leg. The pain may get worse when you walk, run, or jump. How is this diagnosed? This condition may be diagnosed based on: Your symptoms. Your medical history. A physical exam. During the physical exam, your health care  provider may check for: Tenderness along the tendon just below  the patella. Tightness in your thigh muscles. Pain when you straighten your knee. Imaging tests, including: X-rays. These will show the position and condition of your patella. An MRI. This will show any abnormality of the tendon. Ultrasound. This will show any swelling or other abnormalities of the tendon. How is this treated? Treatment for this condition depends on the stage of the condition. It may involve: Avoiding activities that cause pain, such as jumping. Icing and elevating your knee. Having sound wave stimulation to promote healing. Doing physical therapy exercises to improve movement and strength in your knee when pain and swelling improve. Wearing a knee brace. This may be needed if your condition does not improve with treatment. Using crutches or a walker. This may be needed if your condition does not improve with treatment. Surgery. This may be done if you have stage 4 tendinitis. Follow these instructions at home: If you have a removable brace: Wear the brace as told by your health care provider. Remove it only as told by your health care provider. Check the skin around the brace every day. Tell your health care provider about any concerns. Loosen the brace if your toes tingle, become numb, or turn cold and blue. Keep the brace clean. If the brace is not waterproof: Do not let it get wet. Cover it with a watertight covering when you take a bath or shower. Ask your health care provider when it is safe for you to drive. Managing pain, stiffness, and swelling  If directed, put ice on the injured area. To do this: If you have a removable brace, remove it as told by your health care provider. Put ice in a plastic bag. Place a towel between your skin and the bag. Leave the ice on for 20 minutes, 2-3 times a day. Remove the ice if your skin turns bright red. This is very important. If you cannot feel pain, heat,  or cold, you have a greater risk of damage to the area. Move your toes often to reduce stiffness and swelling. Raise (elevate) your knee above the level of your heart while you are sitting or lying down. Activity Do not use the injured limb to support your body weight until your health care provider says that you can. Use your crutches or a walker as told by your health care provider. Do exercises as told by your health care provider or physical therapist. Return to your normal activities as told by your health care provider. Ask your health care provider what activities are safe for you. General instructions Take over-the-counter and prescription medicines only as told by your health care provider. Do not use any products that contain nicotine or tobacco. These products include cigarettes, chewing tobacco, and vaping devices, such as e-cigarettes. These can delay healing. If you need help quitting, ask your health care provider. Keep all follow-up visits. This is important. How is this prevented? Warm up and stretch before being active. Cool down and stretch after being active. Give your body time to rest between periods of activity. You may need to reduce how often you play a sport that requires frequent jumping. Make sure to use equipment that fits you. Be safe and responsible while being active. This will help you avoid falls which can damage the tendon. Do at least 150 minutes of moderate-intensity exercise each week, such as brisk walking or water aerobics. Maintain physical fitness, including: Strength. Flexibility. Cardiovascular fitness. Endurance. Contact a health care provider if: Your symptoms have not improved  in 6 weeks. Your symptoms get worse. Summary Patellar tendinitis is also called jumper's knee or patellar tendinopathy. This condition happens when there is damage to the patellar tendon. Treatment for this condition depends on the stage of the condition and may include  rest, ice, exercises, a knee brace, and surgery. Do not use the injured limb to support your body weight until your health care provider says that you can. Take over-the-counter and prescription medicines only as told by your health care provider. This information is not intended to replace advice given to you by your health care provider. Make sure you discuss any questions you have with your health care provider. Document Revised: 04/18/2021 Document Reviewed: 04/18/2021 Elsevier Patient Education  2024 [employer].

## 2024-03-10 ENCOUNTER — Encounter: Payer: Self-pay | Admitting: Registered Nurse

## 2024-03-10 ENCOUNTER — Telehealth: Payer: Self-pay | Admitting: Registered Nurse

## 2024-03-10 DIAGNOSIS — E039 Hypothyroidism, unspecified: Secondary | ICD-10-CM

## 2024-03-10 DIAGNOSIS — E1169 Type 2 diabetes mellitus with other specified complication: Secondary | ICD-10-CM

## 2024-03-10 NOTE — Telephone Encounter (Signed)
 Patient last filled 12/19/2023 90 tabs levothyroxine  88mcg po daily and atorvastatin  20mg  po daily #90 dispensed 90 tabs each to patient today.  Last labs    Latest Reference Range & Units 12/05/23 07:15  Sodium 134 - 144 mmol/L 142  Potassium 3.5 - 5.2 mmol/L 4.3  Chloride 96 - 106 mmol/L 104  Glucose 70 - 99 mg/dL 409 (H)  BUN 8 - 27 mg/dL 31 (H)  Creatinine 8.11 - 1.00 mg/dL 9.14 (H)  Calcium  8.7 - 10.3 mg/dL 9.9  BUN/Creatinine Ratio 12 - 28  30 (H)  eGFR >59 mL/min/1.73 61  Phosphorus 3.0 - 4.3 mg/dL 2.9 (L)  Alkaline Phosphatase 44 - 121 IU/L 79  Albumin 3.9 - 4.9 g/dL 4.4  Uric Acid 3.0 - 7.2 mg/dL 5.9  AST 0 - 40 IU/L 14  ALT 0 - 32 IU/L 16  Total Protein 6.0 - 8.5 g/dL 6.9  Total Bilirubin 0.0 - 1.2 mg/dL 0.5  GGT 0 - 60 IU/L 13  Estimated CHD Risk 0.0 - 1.0 times avg.  < 0.5  LDH 119 - 226 IU/L 205  Total CHOL/HDL Ratio 0.0 - 4.4 ratio 2.7  Cholesterol, Total 100 - 199 mg/dL 782  HDL Cholesterol >95 mg/dL 67  Triglycerides 0 - 621 mg/dL 308  VLDL Cholesterol Cal 5 - 40 mg/dL 23  LDL Chol Calc (NIH) 0 - 99 mg/dL 90  Iron 27 - 657 ug/dL 95  Globulin, Total 1.5 - 4.5 g/dL 2.5  WBC Q46N6/EX CANCELED  TSH 0.450 - 4.500 uIU/mL 2.860  Thyroxine (T4) 4.5 - 12.0 ug/dL 52.8  Free Thyroxine Index 1.2 - 4.9  3.0  T3 Uptake Ratio 24 - 39 % 30  (H): Data is abnormally high (L): Data is abnormally low  Next labs due Mar 2026

## 2024-03-15 ENCOUNTER — Ambulatory Visit: Payer: Self-pay | Admitting: Registered Nurse

## 2024-07-10 ENCOUNTER — Encounter: Payer: Self-pay | Admitting: Registered Nurse

## 2024-07-10 ENCOUNTER — Telehealth: Payer: Self-pay | Admitting: Registered Nurse

## 2024-07-10 DIAGNOSIS — E1100 Type 2 diabetes mellitus with hyperosmolarity without nonketotic hyperglycemic-hyperosmolar coma (NKHHC): Secondary | ICD-10-CM

## 2024-07-10 NOTE — Telephone Encounter (Signed)
 Epic reminded me patient overdue for BMET follow up elevated glucose and creatinine on [facility] labs.  Noted patient had Hgba1c, CMET with PCM recently  Creatinine normal and Hgba1c improved in 6s.  BMET ordered cancelled as duplicate.  Glucose mg/dL 883 H 70 mg/dL 99    [] Select result BUN mg/dL 21 8 mg/dL 27   [] Select result Creatinine mg/dL 0.9 9.42 mg/dL 1   [] Select result eGFR mL/min/1.73 71 59 mL/min/1.73   [] Select result BUN/Creatinine Ratio 23 12 28    [] Select result Sodium mmol/L 141 134 mmol/L 144   [] Select result Potassium mmol/L 4 3.5 mmol/L 5.2   [] Select result Chloride mmol/L 102 96 mmol/L 106   [] Select result Carbon Dioxide, Total mmol/L 25 20 mmol/L 29   [] Select result Calcium  mg/dL 9.6 8.7 mg/dL 89.6   [] Select result Protein, Total g/dL 6.6 6 g/dL 8.5   [] Select result Albumin g/dL 4.3 3.9 g/dL 4.9   [] Select result Globulin, Total g/dL 2.3 1.5 g/dL 4.5   [] Select result Bilirubin, Total mg/dL 0.4 0 mg/dL 1.2   [] Select result Alkaline Phosphatase IU/L 63 49 IU/L 135   [] Select result AST (SGOT) IU/L 13 0 IU/L 40   [] Select result ALT (SGPT) IU/L 21

## 2024-07-14 ENCOUNTER — Telehealth: Payer: Self-pay | Admitting: Registered Nurse

## 2024-07-14 ENCOUNTER — Encounter: Payer: Self-pay | Admitting: Registered Nurse

## 2024-07-14 DIAGNOSIS — E039 Hypothyroidism, unspecified: Secondary | ICD-10-CM

## 2024-07-14 DIAGNOSIS — E1169 Type 2 diabetes mellitus with other specified complication: Secondary | ICD-10-CM

## 2024-07-14 NOTE — Telephone Encounter (Signed)
 Patient requested [facility] refill last filled 03/10/24  Last PCM visit 06/22/24  had labs Results - Vitamin B12 (06/22/2024 8:50 AM EDT) Component Value Ref Range Test Method Analysis Time Performed At [facility]  Vitamin B-12 396 232 - 1,245 pg/mL     [facility]     Results - Vitamin B12 (06/22/2024 8:50 AM EDT) Specimen (Source) Anatomical Location / Laterality Collection Method / Volume Collection Time Received Time  Blood     06/22/2024 8:50 AM EDT 06/22/2024   Results - Vitamin B12 (06/22/2024 8:50 AM EDT) Narrative  HOYT - 06/23/2024 5:35 AM EDT  Performed at:  [address] - [facility] [address], [place], [place]  [id number] Lab Director: Frankey Sas MD, Phone:  [phone]    Results - Vitamin B12 (06/22/2024 8:50 AM EDT) Authorizing Provider Result Type Result Status  Luke MARLA Manns MD LAB BLOOD ORDERABLES Final Result   Results - Vitamin B12 (06/22/2024 8:50 AM EDT) Performing Organization Address City/State/ZIP Code Phone Number  [email or web address]         [facility]           Back to top of Results    (ABNORMAL) Comprehensive Metabolic Panel (06/22/2024 8:50 AM EDT) Results - (ABNORMAL) Comprehensive Metabolic Panel (06/22/2024 8:50 AM EDT) Component Value Ref Range Test Method Analysis Time Performed At [facility]  Glucose 116 (H) 70 - 99 mg/dL     [facility]    BUN 21 8 - 27 mg/dL     [facility]    Creatinine 0.90 0.57 - 1.00 mg/dL     [facility]    eGFR 71 >59 mL/min/1.73     [facility]    BUN/Creatinine Ratio 23 12 - 28     [facility]    Sodium 141 134 - 144 mmol/L     [facility]    Potassium 4.0 3.5 - 5.2 mmol/L     [facility]    Chloride 102 96 - 106 mmol/L     [facility]    CO2 25 20 - 29 mmol/L     [facility]    CALCIUM  9.6 8.7 - 10.3 mg/dL     [facility]    Total Protein 6.6 6.0 - 8.5 g/dL     [facility]    Albumin, Serum 4.3 3.9 - 4.9 g/dL     [facility]    Globulin, Total 2.3 1.5 - 4.5 g/dL     [facility]    Total Bilirubin 0.4 0.0 - 1.2 mg/dL      [facility]    Alkaline Phosphatase 63 49 - 135 IU/L     [facility]    AST 13 0 - 40 IU/L     [facility]    ALT (SGPT) 21 0 - 32 IU/L     [facility]     Results - (ABNORMAL) Comprehensive Metabolic Panel (06/22/2024 8:50 AM EDT) Specimen (Source) Anatomical Location / Laterality Collection Method / Volume Collection Time Received Time  Blood     06/22/2024 8:50 AM EDT 06/22/2024   Results - (ABNORMAL) Comprehensive Metabolic Panel (06/22/2024 8:50 AM EDT) Narrative  HOYT - 06/23/2024 5:35 AM EDT  Performed at:  [address] [facility] [address], [place], [place]  [id number] Lab Director: Frankey Sas MD, Phone:  [phone]    Results - (ABNORMAL) Comprehensive Metabolic Panel (06/22/2024 8:50 AM EDT) Authorizing Provider Result Type Result Status  Luke MARLA Manns MD LAB BLOOD ORDERABLES Final  Result   Results - (ABNORMAL) Comprehensive Metabolic Panel (06/22/2024 8:50 AM EDT) Performing Organization Address City/State/ZIP Code Phone Number  [email or web address]         [facility]           Back to top of Results    (ABNORMAL) POCT A1C (06/22/2024 8:35 AM EDT) Results - (ABNORMAL) POCT A1C (06/22/2024 8:35 AM EDT) Component Value Ref Range Test Method Analysis Time Performed At [facility]  Hemoglobin A1c 6.6 (A) 4.8 - 5.6 %     [facility]     Results - (ABNORMAL) POCT A1C (06/22/2024 8:35 AM EDT) Specimen (Source) Anatomical Location / Laterality Collection Method / Volume Collection Time Received Time  Blood     06/22/2024 8:35 AM EDT     Results - (ABNORMAL) POCT A1C (06/22/2024 8:35 AM EDT) Narrative      Results - (ABNORMAL) POCT A1C (06/22/2024 8:35 AM EDT) Authorizing Provider Result Type Result Status  Luke MARLA Manns MD POINT OF CARE TEST ORDERABLES Final Result   Results - (ABNORMAL) POCT A1C (06/22/2024 8:35 AM EDT) Performing Organization Address City/State/ZIP Code Phone Number  [facility]  [address]   [place], [place] [address]  [phone]     Back to top of Results    Albumin/Creatinine Ratio, Random Urine (02/11/2024 8:56 AM EDT) Results - Albumin/Creatinine Ratio, Random Urine (02/11/2024 8:56 AM EDT) Component Value Ref Range Test Method Analysis Time Performed At [facility]  Creatinine, Urine 71.3 Not Estab. mg/dL     [facility]    Albumin, Urine 5.8 Not Estab. ug/mL     [facility]    Alb/Creat Ratio 8 0 - 29 mg/g creat     [facility]    Comment:                       Normal:                0 -  29                       Moderately increased: 30 - 300                       Severely increased:       >300    Results - Albumin/Creatinine Ratio, Random Urine (02/11/2024 8:56 AM EDT) Specimen (Source) Anatomical Location / Laterality Collection Method / Volume Collection Time Received Time  Urine     02/11/2024 8:56 AM EDT 02/11/2024   Results - Albumin/Creatinine Ratio, Random Urine (02/11/2024 8:56 AM EDT) Narrative  HOYT - 02/13/2024 1:35 PM EDT  Performed at:  [address] [facility] [address] [place], [place]  [id number] Lab Director: Frankey Sas MD, Phone:  [phone]    Results - Albumin/Creatinine Ratio, Random Urine (02/11/2024 8:56 AM EDT) Authorizing Provider Result Type Result Status  Luke MARLA Manns MD URINE ORDERABLES Final Result   Results - Albumin/Creatinine Ratio, Random Urine (02/11/2024 8:56 AM EDT) Performing Organization Address City/State/ZIP Code Phone Number  [email or web address]         [facility]           Back to top of Results    Hemoglobin (12/05/2023) Results - Hemoglobin (12/05/2023) Component Value Ref Range Test Method Analysis Time Performed At [facility]  HGB 13.2 12.2 - 14.9 g/dL           Results -  Hemoglobin (12/05/2023) Specimen (Source) Anatomical Location / Laterality Collection Method / Volume Collection Time Received Time  Blood           Results - Hemoglobin (12/05/2023) Narrative      Results -  Hemoglobin (12/05/2023) Authorizing Provider Result Type Result Status  Historical Provider MD LAB BLOOD ORDERABLES Final Result    Back to top of Results    TSH (12/05/2023) Results - TSH (12/05/2023) Component Value Ref Range Test Method Analysis Time Performed At [facility]  TSH 2.800 0.340 - 5.600 mcIU/mL           Results - TSH (12/05/2023) Specimen (Source) Anatomical Location / Laterality Collection Method / Volume Collection Time Received Time  Blood           Results - TSH (12/05/2023) Narrative      Results - TSH (12/05/2023) Authorizing Provider Result Type Result Status  Historical Provider MD LAB BLOOD ORDERABLES Final Result    Back to top of Results    Lipid Panel (12/05/2023) Results - Lipid Panel (12/05/2023) Component Value Ref Range Test Method Analysis Time Performed At [facility]  Cholesterol 180 100 - 199 mg/dL          HDL 67 >=60 mg/dl          Triglycerides 136 0 - 149 mg/dL          LDL 90 0 - 99 mg/dL           Results - Lipid Panel (12/05/2023) Specimen (Source) Anatomical Location / Laterality Collection Method / Volume Collection Time Received Time  Blood           Next lipid panel due March 2026 along with TSH  Dispensed 90 day supply to patient today of each medication from [facility]

## 2024-10-08 ENCOUNTER — Telehealth: Payer: Self-pay | Admitting: Registered Nurse

## 2024-10-08 ENCOUNTER — Encounter: Payer: Self-pay | Admitting: Registered Nurse

## 2024-10-08 DIAGNOSIS — Z0000 Encounter for general adult medical examination without abnormal findings: Secondary | ICD-10-CM

## 2024-10-08 DIAGNOSIS — E039 Hypothyroidism, unspecified: Secondary | ICD-10-CM

## 2024-10-08 DIAGNOSIS — E1169 Type 2 diabetes mellitus with other specified complication: Secondary | ICD-10-CM

## 2024-10-08 MED ORDER — LEVOTHYROXINE SODIUM 88 MCG PO TABS: 88.0000 ug | ORAL_TABLET | Freq: Every day | ORAL | 3 refills | Status: AC

## 2024-10-08 MED ORDER — ATORVASTATIN CALCIUM 20 MG PO TABS: 20.0000 mg | ORAL_TABLET | Freq: Every day | ORAL | 3 refills | Status: AC

## 2024-10-08 NOTE — Telephone Encounter (Signed)
 Patient last filled atorvastatin  20mg  po daily and levothyroxine  88mcg po daily #90 each 07/14/2024  Dispensed 90 tabs each to patient today  Last BP 10/02/2024 Vital Signs: BP 130/70 (BP Location: Left Upper Arm, Patient Position: Sitting)  Pulse 67  Temp 96.8 F (36 C) (Temporal)  Resp 14  Ht 5' 7.28 (1.709 m)  Wt 210 lb 3.2 oz (95.3 kg)  SpO2 95%  BMI 32.65 kg/m  Specimen: Blood Component Ref Range & Units 6 d ago  TSH 0.450 - 4.50 uIU/mL 1.690  Resulting Agency [facility]  Narrative Performed by [employer] Performed at:  [address] - [facility] [address], [place], [place]  [id number] Lab Director: Frankey Sas MD, Phone:  [phone] Specimen Collected: 10/02/24 09:05   Performed by: HOYT Last Resulted: 10/03/24 05:35  Received From: [facility]  Result Received: 10/08/24 11:02   imen: Blood Component Ref Range & Units 6 d ago  Cholesterol, Total 100 - 199 mg/dL 840  Triglycerides 0 - 149 mg/dL 894  HDL >60 mg/dL 64  VLDL Cholesterol Cal 5 - 40 mg/dL 19  LDL 0 - 99 mg/dL 76  Resulting Agency [facility]  Narrative Performed by [employer] Performed at:  [address] [facility] [address], [place], [place]  [id number] Lab Director: Frankey Sas MD, Phone:  [phone] Specimen Collected: 10/02/24 09:05   Performed by: HOYT Last Resulted: 10/03/24 05:35  Received From: [facility]  Result Received: 10/08/24 11:02    Component Ref Range & Units 6 d ago  Glucose 70 - 99 mg/dL 889 High   BUN 8 - 27 mg/dL 32 High   Creatinine 9.42 - 1.00 mg/dL 8.87 High   eGFR >40 fO/fpw/8.26 54 Low   BUN/Creatinine Ratio 12 - 28 29 High   Sodium 134 - 144 mmol/L 139  Potassium 3.5 - 5.2 mmol/L 4.2  Chloride 96 - 106 mmol/L 104  CO2 20 - 29 mmol/L 22  CALCIUM  8.7 - 10.3 mg/dL 89.9  Total Protein 6.0 - 8.5 g/dL 6.7  Albumin, Serum 3.9 - 4.9 g/dL 4.5  Globulin, Total 1.5 - 4.5 g/dL 2.2  Total Bilirubin 0.0 - 1.2 mg/dL 0.4  Alkaline Phosphatase 49 -  135 IU/L 60  AST 0 - 40 IU/L 14  ALT (SGPT) 0 - 32 IU/L 15  Resulting Agency [facility]  Narrative Performed by [employer] Performed at:  [address] [facility] [address] [place], [place]  [id number] Lab Director: Frankey Sas MD, Phone:  [phone] Specimen Collected: 10/02/24 09:05   Performed by: HOYT Last Resulted: 10/03/24 05:35  Received From: [facility]  Result Received: 10/08/24 11:02    Component 10/02/24 06/22/24 02/11/24 11/06/23 06/14/23 02/04/23  Hemoglobin A1c 6.5 Abnormal  6.6 Abnormal  6.7 Abnormal  6.8 Abnormal  6.8 Abnormal    Patient notified next labs due in 1 year.  Patient to sign [facility] ROI, tobacco attestation and HIPAA release form with RN Karene today met requirements for insurance discount starting 24 Jun 2025 as LDL less than 130 and Hgba1c less than 7.  Patient asked for daughter in law to be at appt with her today [employer] came to clinic.  Discussed communicable disease policy e.g. stay at home and notify clinic staff if positive flu/covid test, fever greater than 100.5 or vomiting/diarrhea in previous 24 hours, clinic hours, new light system e.g. staff in clinic and available (green), staff assisting another patient (yellow) or clinic closed (red)  NP in clinic 1/2 days Tuesday and Thursday and  RN in clinic 8a-5p M-Th  Patient verbalized understanding information/instructions, agreed with plan of care and had no further questions at this time.
